# Patient Record
Sex: Female | Born: 1975 | Race: White | Hispanic: Yes | Smoking: Never smoker
Health system: Southern US, Community
[De-identification: ages and names within clinical notes are randomized; demographics above are authoritative.]

## PROBLEM LIST (undated history)

## (undated) DIAGNOSIS — E079 Disorder of thyroid, unspecified: Secondary | ICD-10-CM

## (undated) HISTORY — DX: Disorder of thyroid, unspecified: E07.9

## (undated) HISTORY — PX: CHOLECYSTECTOMY: SHX55

---

## 2005-02-11 ENCOUNTER — Inpatient Hospital Stay (HOSPITAL_COMMUNITY): Admission: AD | Admit: 2005-02-11 | Discharge: 2005-02-11 | Payer: Self-pay | Admitting: Obstetrics & Gynecology

## 2005-02-21 ENCOUNTER — Inpatient Hospital Stay (HOSPITAL_COMMUNITY): Admission: RE | Admit: 2005-02-21 | Discharge: 2005-02-21 | Payer: Self-pay | Admitting: Family Medicine

## 2005-03-08 ENCOUNTER — Ambulatory Visit (HOSPITAL_COMMUNITY): Admission: RE | Admit: 2005-03-08 | Discharge: 2005-03-08 | Payer: Self-pay | Admitting: Obstetrics

## 2005-03-24 ENCOUNTER — Inpatient Hospital Stay (HOSPITAL_COMMUNITY): Admission: AD | Admit: 2005-03-24 | Discharge: 2005-03-24 | Payer: Self-pay | Admitting: Obstetrics & Gynecology

## 2005-05-04 ENCOUNTER — Inpatient Hospital Stay (HOSPITAL_COMMUNITY): Admission: AD | Admit: 2005-05-04 | Discharge: 2005-05-07 | Payer: Self-pay | Admitting: Family Medicine

## 2005-05-11 ENCOUNTER — Encounter (HOSPITAL_COMMUNITY): Admission: RE | Admit: 2005-05-11 | Discharge: 2005-06-10 | Payer: Self-pay | Admitting: Obstetrics

## 2005-05-18 ENCOUNTER — Inpatient Hospital Stay (HOSPITAL_COMMUNITY): Admission: AD | Admit: 2005-05-18 | Discharge: 2005-05-20 | Payer: Self-pay | Admitting: Obstetrics

## 2005-05-19 ENCOUNTER — Encounter (INDEPENDENT_AMBULATORY_CARE_PROVIDER_SITE_OTHER): Payer: Self-pay | Admitting: *Deleted

## 2006-01-17 ENCOUNTER — Inpatient Hospital Stay (HOSPITAL_COMMUNITY): Admission: AD | Admit: 2006-01-17 | Discharge: 2006-01-18 | Payer: Self-pay | Admitting: Family Medicine

## 2006-02-14 ENCOUNTER — Encounter: Payer: Self-pay | Admitting: Family Medicine

## 2006-02-14 ENCOUNTER — Ambulatory Visit: Payer: Self-pay | Admitting: Family Medicine

## 2006-07-17 ENCOUNTER — Ambulatory Visit: Payer: Self-pay | Admitting: Obstetrics & Gynecology

## 2006-08-01 ENCOUNTER — Ambulatory Visit: Payer: Self-pay | Admitting: Gynecology

## 2006-08-17 ENCOUNTER — Inpatient Hospital Stay (HOSPITAL_COMMUNITY): Admission: AD | Admit: 2006-08-17 | Discharge: 2006-08-17 | Payer: Self-pay | Admitting: Obstetrics & Gynecology

## 2006-08-20 ENCOUNTER — Ambulatory Visit: Payer: Self-pay | Admitting: Obstetrics & Gynecology

## 2006-08-20 ENCOUNTER — Ambulatory Visit (HOSPITAL_COMMUNITY): Admission: RE | Admit: 2006-08-20 | Discharge: 2006-08-20 | Payer: Self-pay | Admitting: Obstetrics & Gynecology

## 2006-08-24 ENCOUNTER — Inpatient Hospital Stay (HOSPITAL_COMMUNITY): Admission: AD | Admit: 2006-08-24 | Discharge: 2006-08-25 | Payer: Self-pay | Admitting: Obstetrics and Gynecology

## 2006-09-05 ENCOUNTER — Ambulatory Visit: Payer: Self-pay | Admitting: Obstetrics & Gynecology

## 2006-10-03 ENCOUNTER — Ambulatory Visit: Payer: Self-pay | Admitting: Obstetrics and Gynecology

## 2007-01-24 ENCOUNTER — Ambulatory Visit (HOSPITAL_COMMUNITY): Admission: RE | Admit: 2007-01-24 | Discharge: 2007-01-24 | Payer: Self-pay | Admitting: Gynecology

## 2007-09-17 ENCOUNTER — Inpatient Hospital Stay (HOSPITAL_COMMUNITY): Admission: AD | Admit: 2007-09-17 | Discharge: 2007-11-07 | Payer: Self-pay | Admitting: Obstetrics

## 2007-09-23 ENCOUNTER — Encounter: Payer: Self-pay | Admitting: Obstetrics

## 2007-10-13 ENCOUNTER — Encounter: Payer: Self-pay | Admitting: Obstetrics

## 2007-11-01 ENCOUNTER — Encounter: Payer: Self-pay | Admitting: Obstetrics & Gynecology

## 2007-11-15 ENCOUNTER — Inpatient Hospital Stay (HOSPITAL_COMMUNITY): Admission: AD | Admit: 2007-11-15 | Discharge: 2007-11-15 | Payer: Self-pay | Admitting: Gynecology

## 2007-11-16 ENCOUNTER — Inpatient Hospital Stay (HOSPITAL_COMMUNITY): Admission: AD | Admit: 2007-11-16 | Discharge: 2007-11-16 | Payer: Self-pay | Admitting: Obstetrics & Gynecology

## 2007-11-21 ENCOUNTER — Inpatient Hospital Stay (HOSPITAL_COMMUNITY): Admission: AD | Admit: 2007-11-21 | Discharge: 2007-11-21 | Payer: Self-pay | Admitting: Obstetrics & Gynecology

## 2007-11-22 ENCOUNTER — Inpatient Hospital Stay (HOSPITAL_COMMUNITY): Admission: AD | Admit: 2007-11-22 | Discharge: 2007-11-22 | Payer: Self-pay | Admitting: Gynecology

## 2007-11-23 ENCOUNTER — Inpatient Hospital Stay (HOSPITAL_COMMUNITY): Admission: AD | Admit: 2007-11-23 | Discharge: 2007-11-23 | Payer: Self-pay | Admitting: Obstetrics and Gynecology

## 2007-11-27 ENCOUNTER — Inpatient Hospital Stay (HOSPITAL_COMMUNITY): Admission: AD | Admit: 2007-11-27 | Discharge: 2007-11-27 | Payer: Self-pay | Admitting: Obstetrics

## 2007-11-28 ENCOUNTER — Inpatient Hospital Stay (HOSPITAL_COMMUNITY): Admission: AD | Admit: 2007-11-28 | Discharge: 2007-11-28 | Payer: Self-pay | Admitting: Obstetrics

## 2008-09-11 IMAGING — US US OB LIMITED
1 series · 14 of 14 positions shown · non-contrast
Comparison: none

OBSTETRICAL ULTRASOUND:

 This ultrasound exam was performed in the [HOSPITAL] Ultrasound Department.  The OB US report was generated in the AS system, and faxed to the ordering physician.  This report is also available in [REDACTED] PACS.

[Series 1: us ob limited · 0.43mm/px · 14 of 14 slices shown]
[im 1/14]
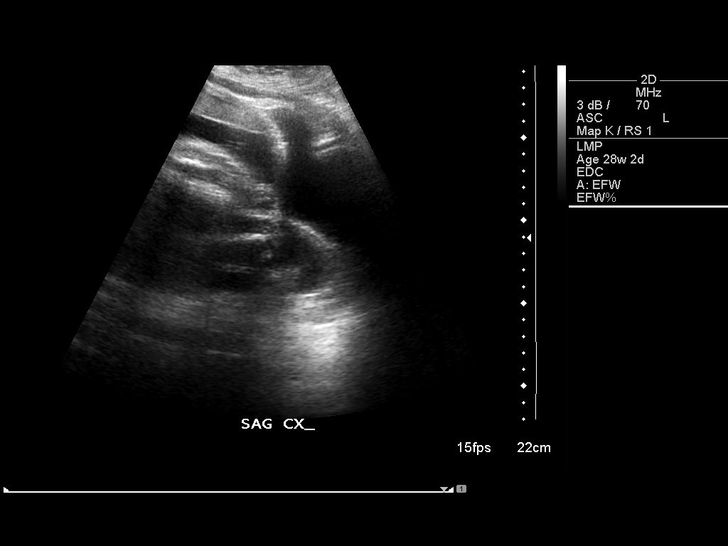
[im 2/14]
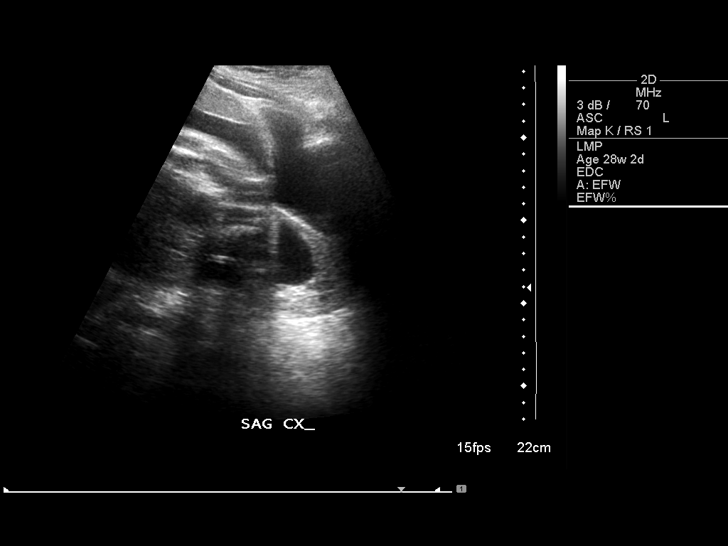
[im 3/14]
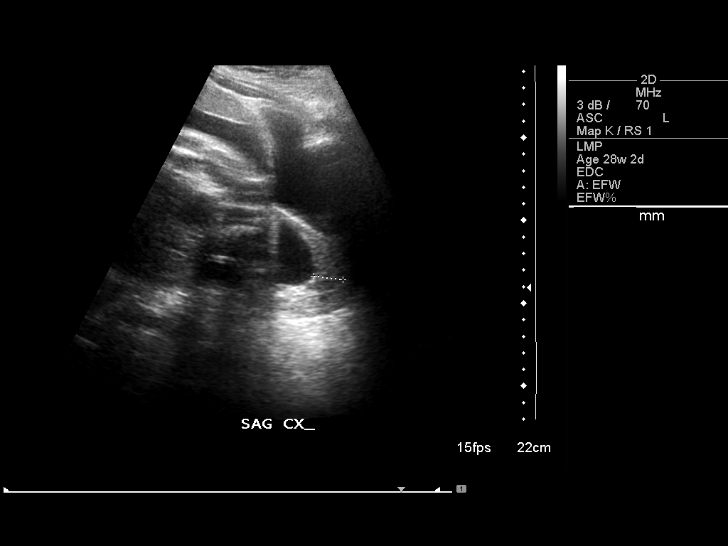
[im 4/14]
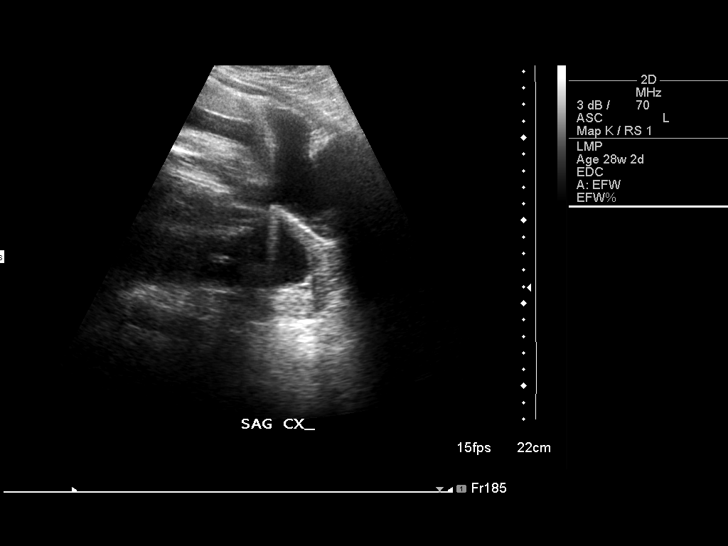
[im 5/14]
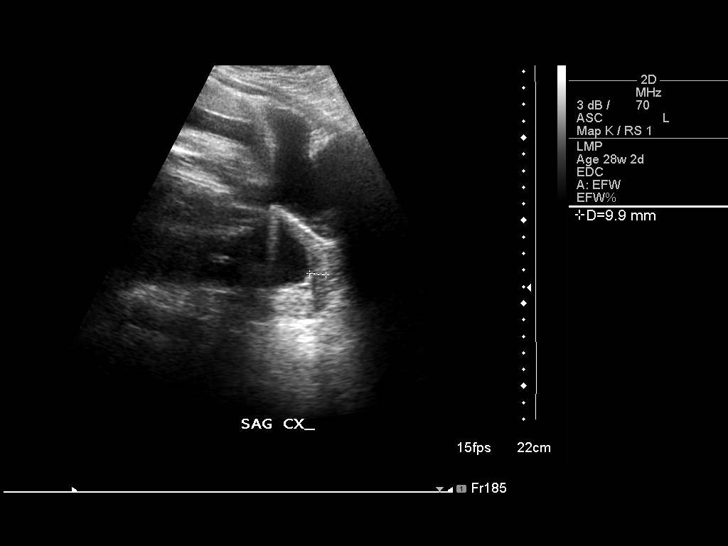
[im 6/14]
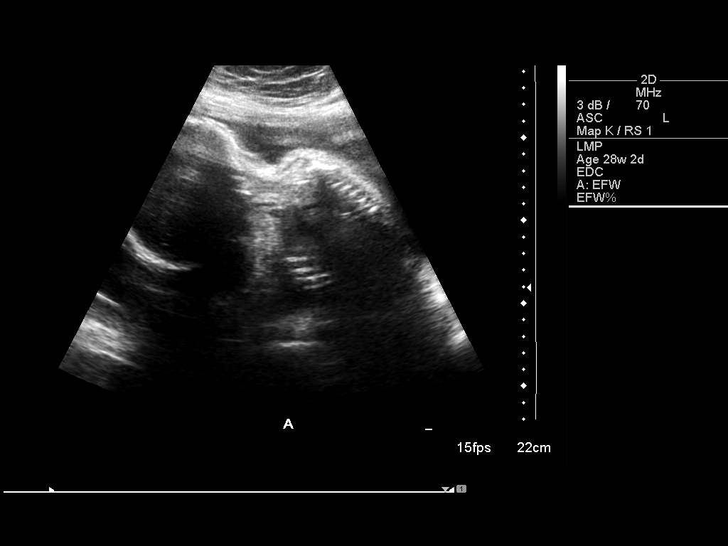
[im 7/14]
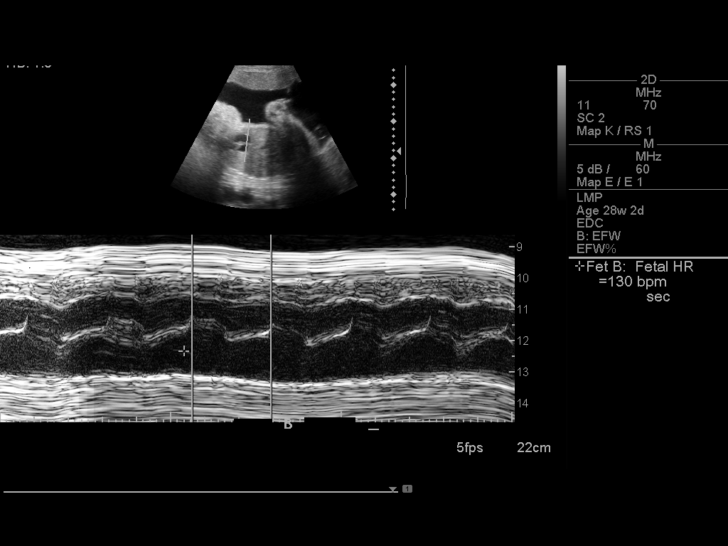
[im 8/14]
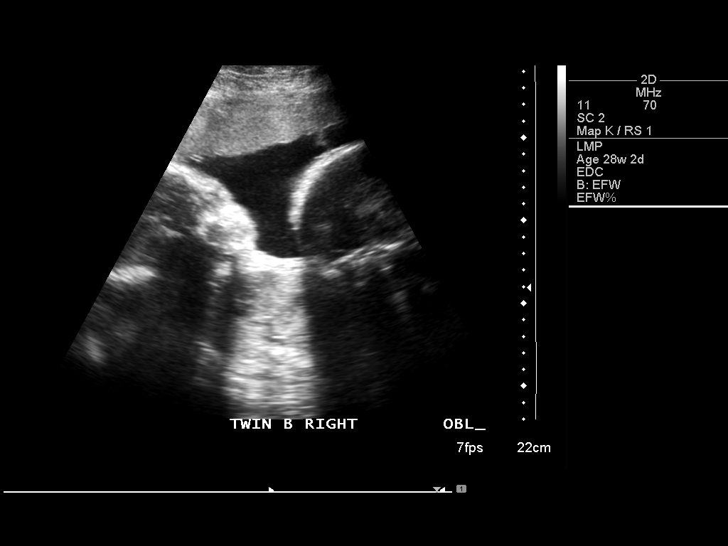
[im 9/14]
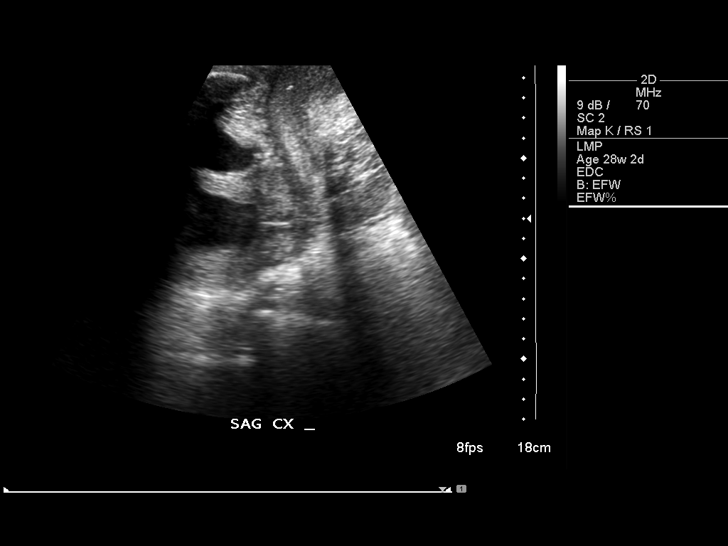
[im 10/14]
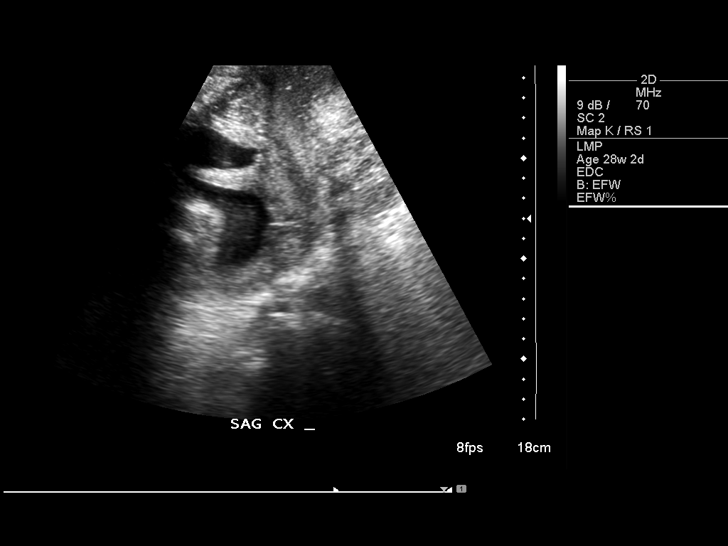
[im 11/14]
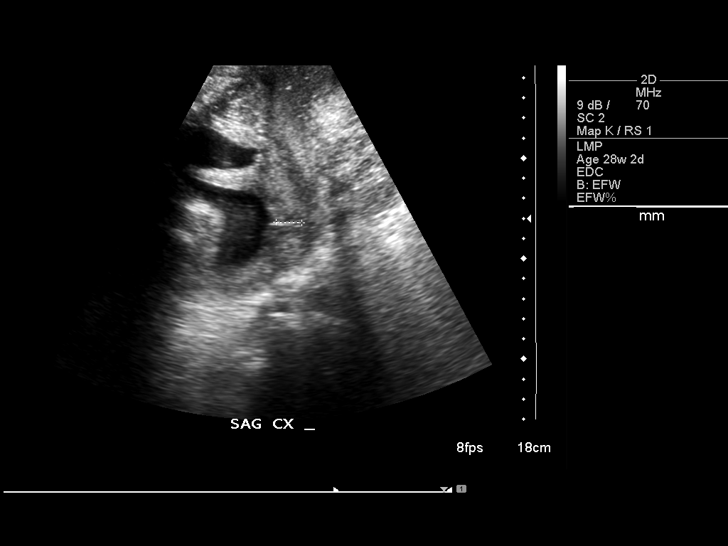
[im 12/14]
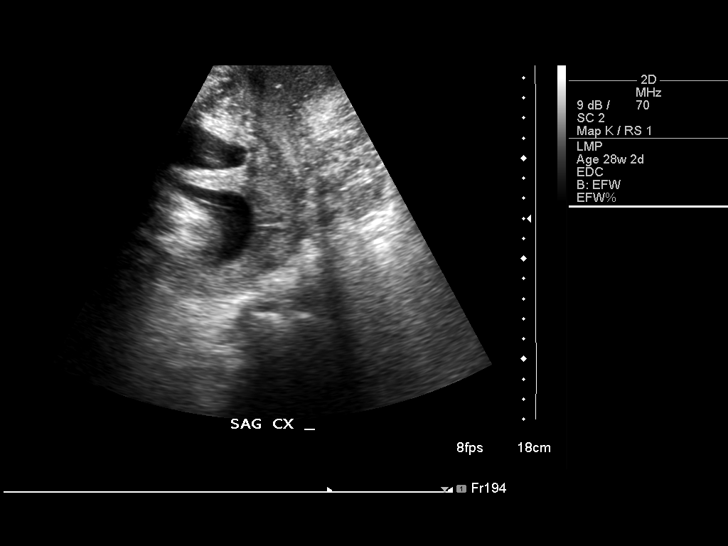
[im 13/14]
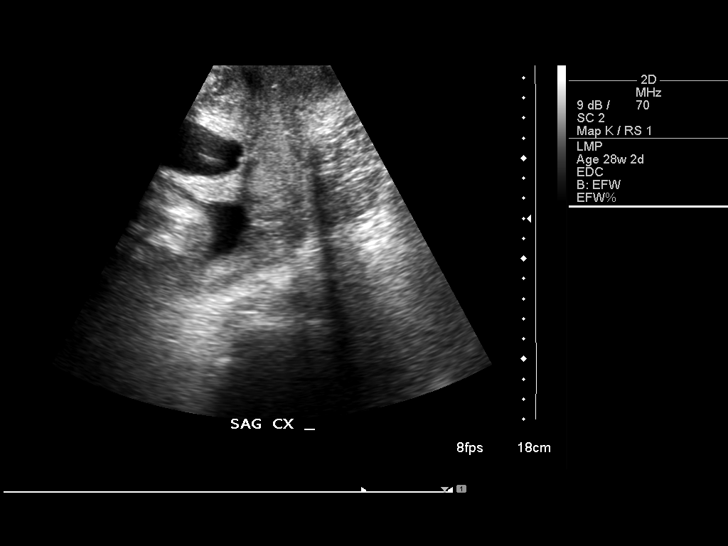
[im 14/14]
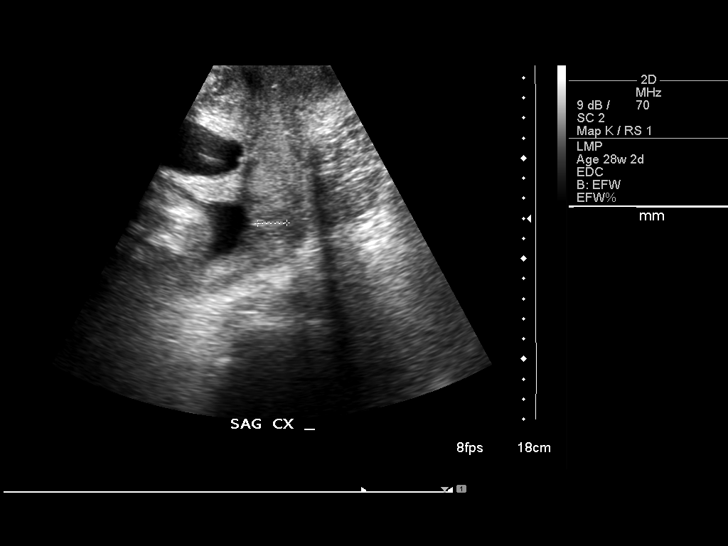

[14 of 14 positions shown; findings below may reference images not displayed]

IMPRESSION: See AS Obstetric US report.

## 2008-09-13 IMAGING — US US OB LIMITED
1 series · 14 of 28 positions shown · non-contrast
Comparison: none

OBSTETRICAL ULTRASOUND:

 This ultrasound exam was performed in the [HOSPITAL] Ultrasound Department.  The OB US report was generated in the AS system, and faxed to the ordering physician.  This report is also available in [REDACTED] PACS.

[Series 1: us ob limited · 0.39mm/px · 14 of 29 slices shown]
[im 2/29]
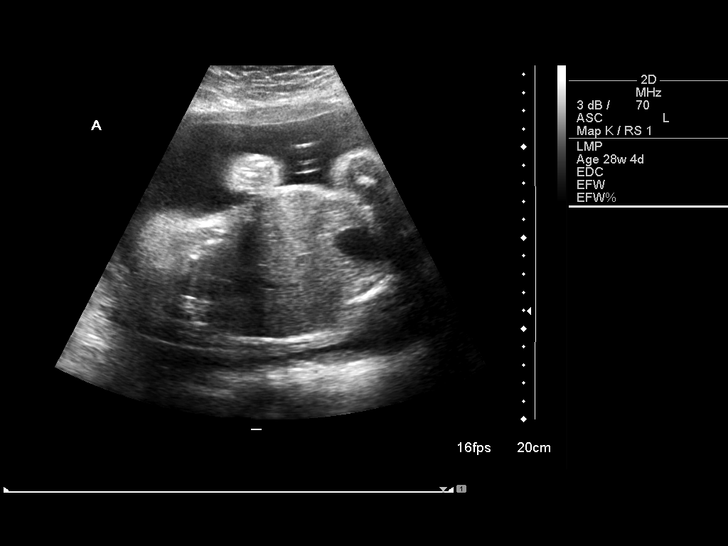
[im 4/29]
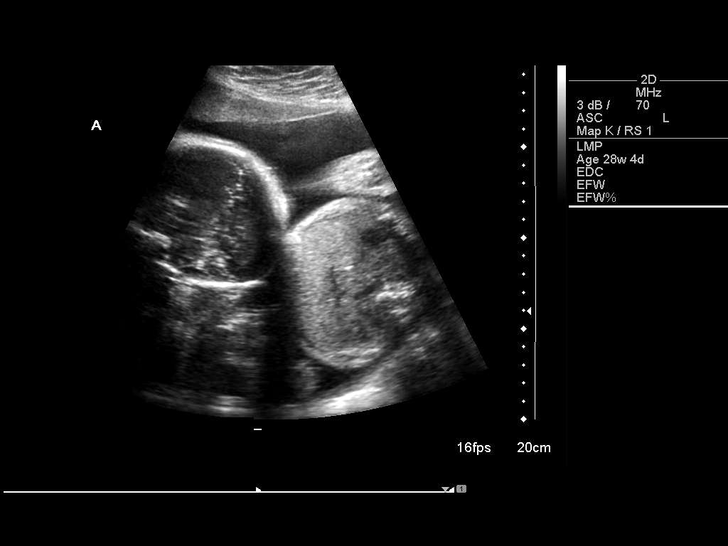
[im 6/29]
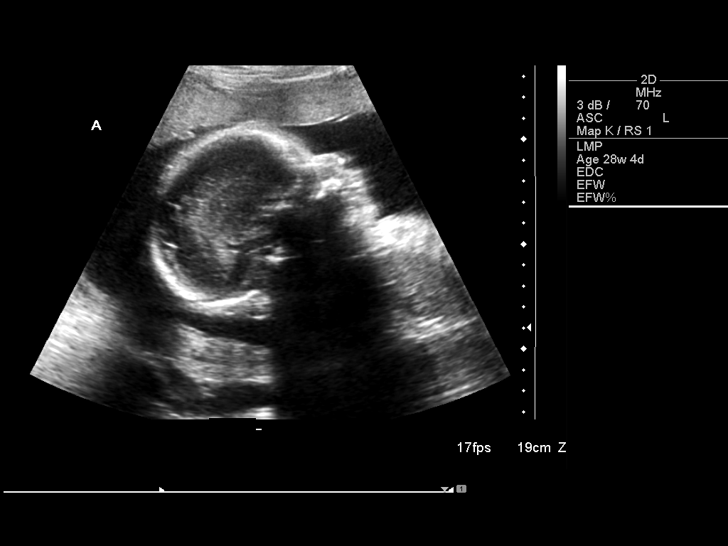
[im 8/29]
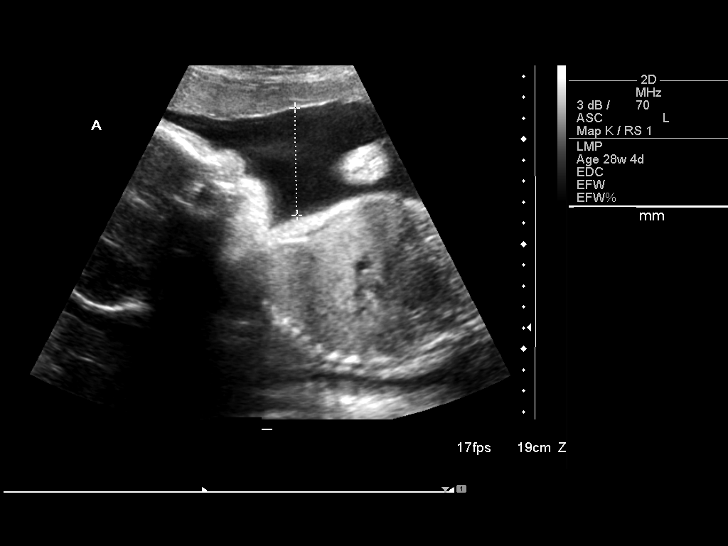
[im 10/29]
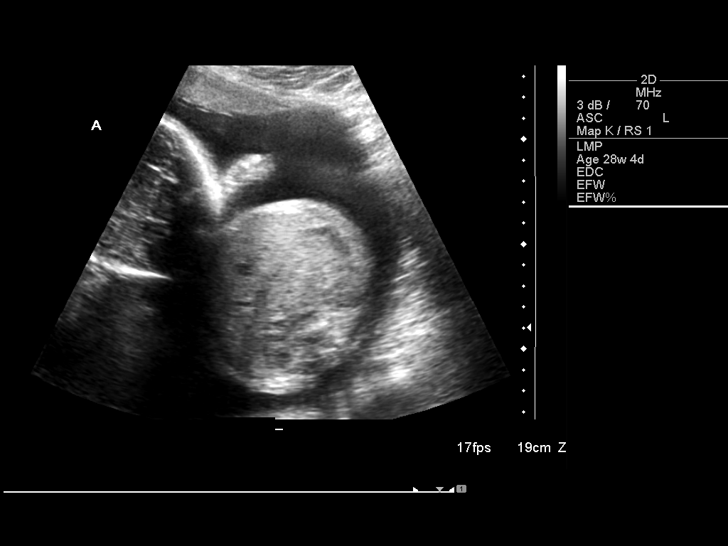
[im 12/29]
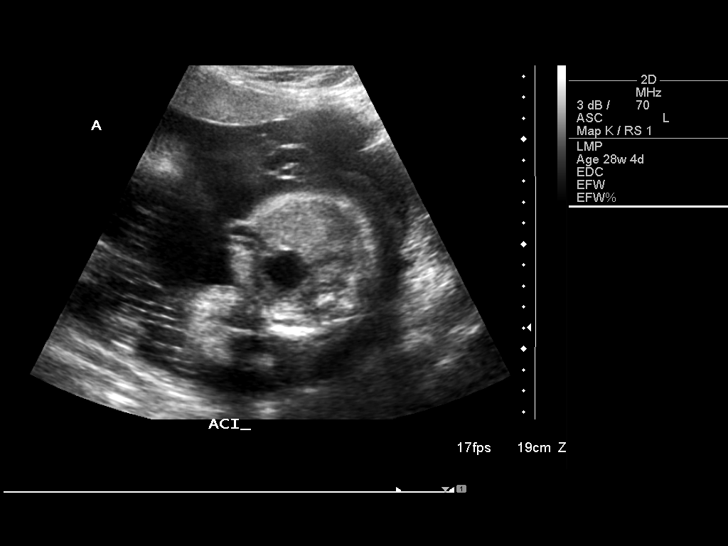
[im 14/29]
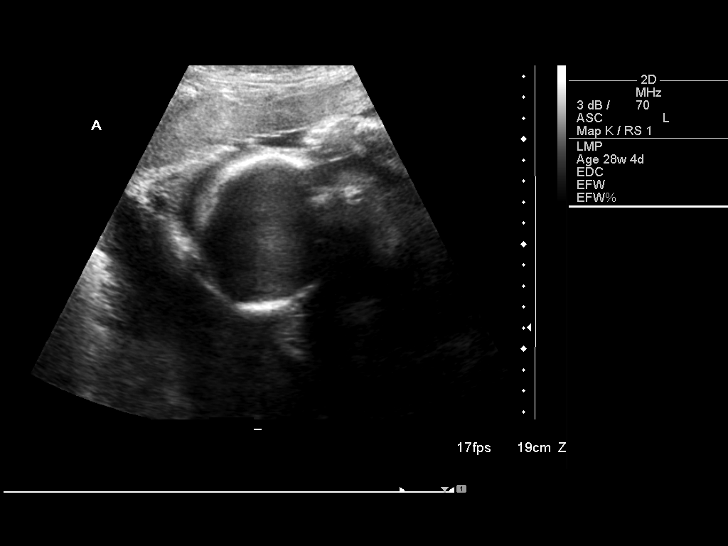
[im 16/29]
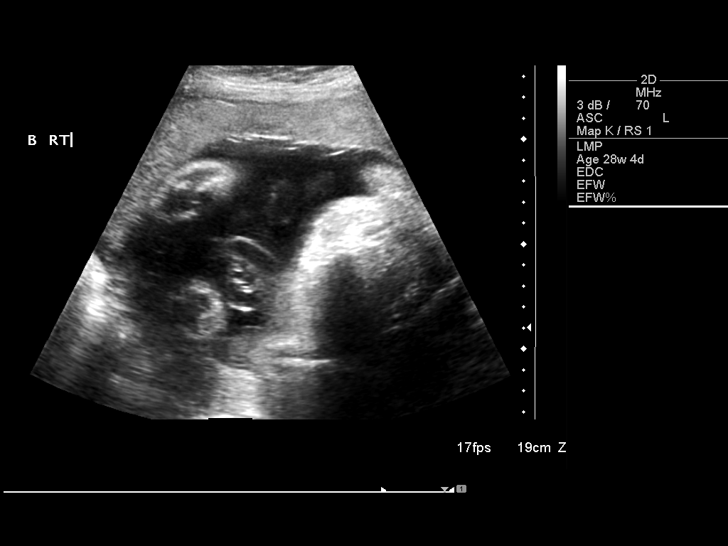
[im 18/29]
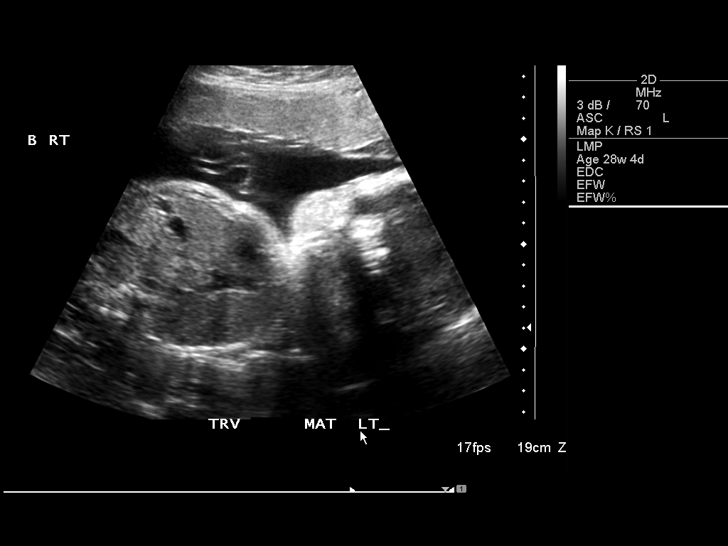
[im 20/29]
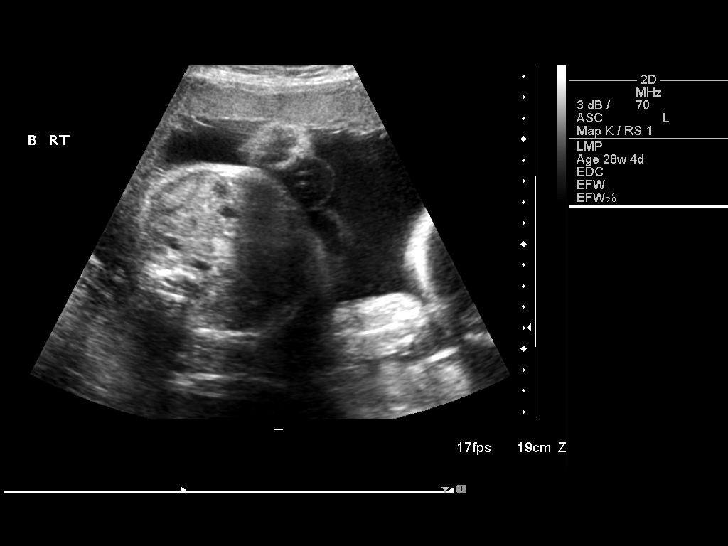
[im 22/29]
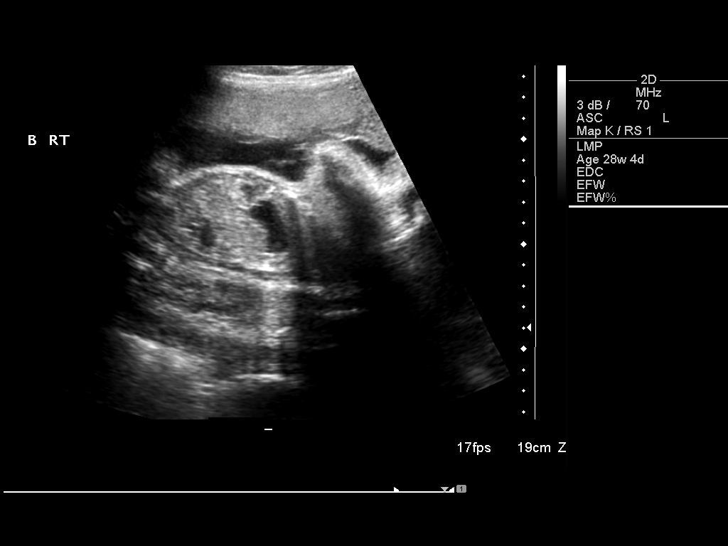
[im 24/29]
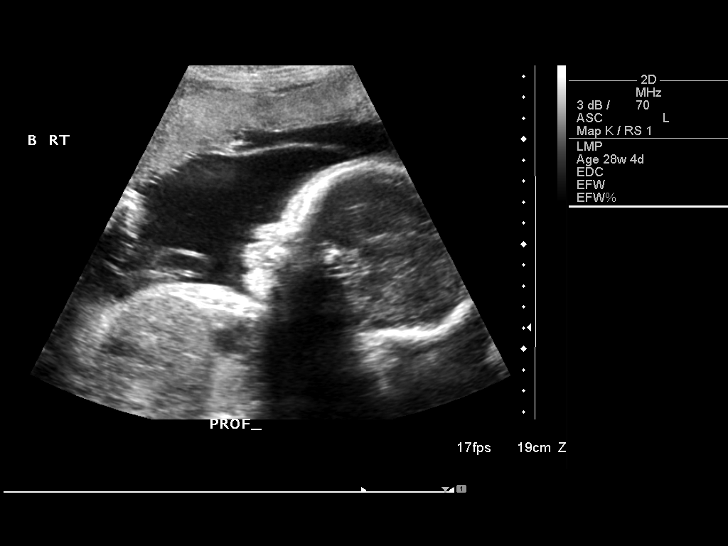
[im 26/29]
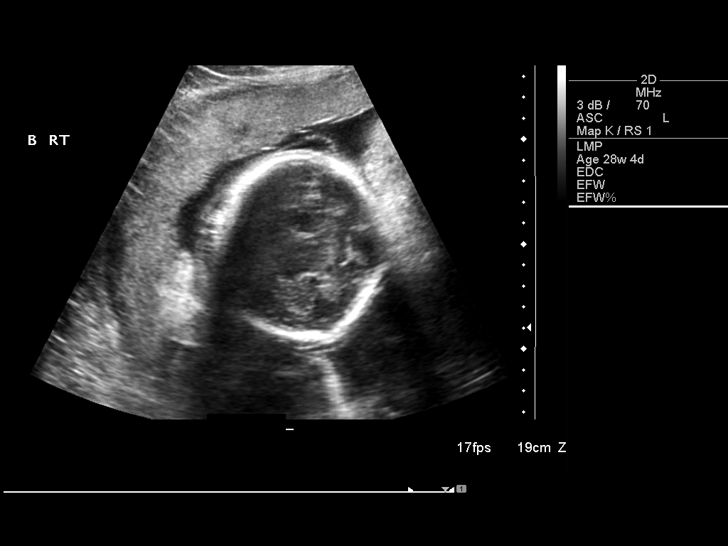
[im 29/29]
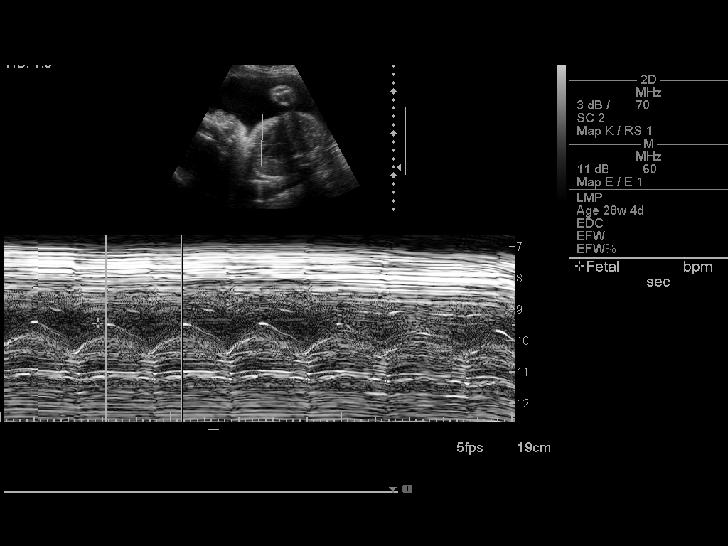

[14 of 28 positions shown; findings below may reference images not displayed]

IMPRESSION: See AS Obstetric US report.

## 2011-05-15 NOTE — Consult Note (Signed)
NAME:  Sierra Novak, Sierra Novak       ACCOUNT NO.:  [id number]   MEDICAL RECORD NO.:  [id number]          PATIENT TYPE:  INP   LOCATION:  9154                          [facility]   PHYSICIAN:  Toma Copier, MD           DATE OF BIRTH:  1976/07/31   DATE OF CONSULTATION:  09/22/2007  DATE OF DISCHARGE:                                 CONSULTATION   At the time of consultation, language interpreter, Veryl Speak Royal, was  present for the interpretation.   REFERRING PHYSICIAN:  Charles A. Clearance Coots, M.D.   REASON FOR CONSULTATION:  The patient is a gravida 5, para 1-0-3-0, at  27-[redacted] weeks gestation with a twin intrauterine pregnancy and there is  concern for cervical insufficiency.   The patient was admitted on September 17, 2007, after an office visit  was concerning for some vaginal bleeding and vaginal discharge and there  was no report of whether there was cervical dilatation.  She was  admitted for concern of cervical insufficiency versus preterm labor in  this preterm twin intrauterine pregnancy.  She has been followed by [facility] most recently on September 16, and has been managed with  modified bed rest.   Since her admission, the patient reports that there has been no  additional vaginal bleeding or abnormal discharge.  She denies any  uterine contractions and reports good fetal movement x2.  She has  received a course of antenatal corticosteroids.   PAST MEDICAL HISTORY:   PAST SURGICAL HISTORY:  Notable for cholecystectomy.   MEDICATIONS:  1. Prenatal vitamins.  2. Progesterone suppositories.  3. Baby aspirin.   ALLERGIES:  No known drug allergies.   SOCIAL HISTORY:  Negative history of tobacco, alcohol, or drug abuse.   FAMILY HISTORY:  There is a family history positive for hypertension.   PAST OBSTETRICAL HISTORY:  First pregnancy was complicated with an  intrauterine fetal demise at [redacted] weeks gestation.  Second pregnancy was  first trimester spontaneous  abortion.  Third pregnancy was 18-week  spontaneous loss.  Fourth pregnancy was first trimester spontaneous  loss.  Rhett Bannister 5 is a spontaneously conceived twin pregnancy.  There are  no ultrasound reports to review assessing cervical status at the time of  discharge.   PHYSICAL EXAMINATION:  GENERAL:  The patient is alert and oriented  without any complaints and in no acute distress.  Fetal heart rate  tracings were noted to be reassuring x2.  VITAL SIGNS:  Mother is afebrile and vital signs are stable.   RECOMMENDATIONS:  1. I recommend that we obtain an ultrasound evaluation of the cervix      and assess the fetal growth prior to making any additional      recommendations regarding inpatient versus outpatient care.  2. Based on the findings from the ultrasound assessment, I will gladly      make additional recommendations related to need for pharmacotherapy      and/or tocolytics.   Total face-to-face time during consultation was 30 minutes.  All  questions were answered to the patient.   Thank you for allowing me to participate.  If you have any additional  questions, please feel free to contact me.      Toma Copier, MD  Electronically Signed     SJ/MEDQ  D:  09/22/2007  T:  09/22/2007  Job:  [id number]

## 2011-05-15 NOTE — Op Note (Signed)
NAMEKRITIKA, STUKES       ACCOUNT NO.:  [id number]   MEDICAL RECORD NO.:  [id number]          PATIENT TYPE:  INP   LOCATION:                                [facility]   PHYSICIAN:  Roseanna Rainbow, M.D.DATE OF BIRTH:  [birth year]   DATE OF PROCEDURE:  11/01/2007  DATE OF DISCHARGE:                               OPERATIVE REPORT   PREOPERATIVE DIAGNOSIS:  Twin gestation at 33+ weeks, preterm labor,  breech, breech presentation, history of previous cesarean delivery.   POSTOPERATIVE DIAGNOSIS:  Twin gestation at 33+ weeks, preterm labor,  breech, breech presentation, history of previous cesarean delivery.   PROCEDURE:  Repeat low uterine flap elliptical cesarean delivery via  midline skin incision.   SURGEON:  Antionette Char, M.D.   ANESTHESIA:  Spinal.   PATHOLOGY:  Placenta.   ESTIMATED BLOOD LOSS:  600 mL.   COMPLICATIONS:  None.   IV FLUIDS/URINE OUTPUT:  As per anesthesiology.   PROCEDURE:  The patient was taken to the operating room with an IV  running and a Foley catheter in place.  She was given a spinal  anesthetic and then placed in the dorsal supine position with a leftward  tilt.  She was prepped and draped in the usual sterile fashion.  After a  time-out had been completed, the previous midline scar was excised with  the scalpel and Bovie.  The incision was then carried down to the  underlying fascia.  The fascia was incised along the length of the  incision.  The parietal peritoneum was entered and this incision was  then extended superiorly and inferiorly with good visualization of the  bladder.  The Alexis retractor was then placed into the incision.  The  vesicouterine peritoneum was tented up and entered sharply.  This  incision was then extended bilaterally and the bladder flap created  sharply.  The lower uterine segment was then incised in a transverse  fashion with a scalpel.  This incision was then extended bluntly.  Twin  A was  delivered as a complete breech extraction.  The cord was clamped  and cut and the infant was handed off to the awaiting neonatologist.  Twin B was delivered as a complete breech extraction as well.  The cord  was clamped and cut and the infant was handed off to the awaiting  neonatologist.  Twin A was a female. The umbilical artery pH was 7.35.  Twin B was female. the umbilical artery pH was 7.39.  The Apgars are 8 and  9 at one and one and 5 minutes respectively.  The placenta was then  removed, the intrauterine cavity was evacuated of any remaining amniotic  fluid, clots and debris with moist laparotomy sponge.  The uterine  incision was then closed in a running interlocking fashion using O  Monocryl.  A second imbricating layer of the same suture was then  placed.  Adequate hemostasis was noted.  The paracolic gutters were then  irrigated.  The retractor was then removed.  The parietal peritoneum and  fascia was closed in a single layer using  running sutures of #1 PDS.  The  skin was closed with staples.  At the  close of the procedure the instrument and pack counts were said to be  correct x2.  2 grams of cephazolin had been given at cord clamp.  The  patient was taken to the PACU awake and in stable condition.      Roseanna Rainbow, M.D.  Electronically Signed     LAJ/MEDQ  D:  11/01/2007  T:  11/03/2007  Job:  [id number]

## 2011-05-15 NOTE — H&P (Signed)
Sierra Novak, Sierra Novak       ACCOUNT NO.:  [id number]   MEDICAL RECORD NO.:  [id number]          PATIENT TYPE:  INP   LOCATION:  9154                          [facility]   PHYSICIAN:  Charles A. Clearance Coots, M.D.DATE OF BIRTH:  Jul 20, 1976   DATE OF ADMISSION:  09/17/2007  DATE OF DISCHARGE:                              HISTORY & PHYSICAL   ADMITTING DIAGNOSES:  1. Twenty-seven-week gestation.  2. Preterm cervical changes.  3. History of cervical incompetence.  4. Multiple pregnancy losses.   REASON FOR ADMISSION:  This is a 35 year old Hispanic female G5, P0 at  approximately [redacted] weeks gestation with twins, history of preterm cervical  changes and been followed with weekly ultrasounds for cervical  measurement at [facility].  She was seen at [facility] on  September 15, 2006 and complained of a brownish-to-pinkish vaginal  discharge, she but denied cramping or pressure.  The patient has been on  home management with modified bedrest. If the new findings were  discussed with Dr. Particia Nearing at [facility] and even though the  ultrasound for cervical measurement was reassuring it was thought that  with this new vaginal discharge that the patient should be admitted for  bedrest.   PAST MEDICAL/SURGICAL HISTORY:  Surgery - cholecystectomy.  Illnesses;  none.   MEDICATIONS:  Prenatal vitamins, progesterone suppositories and baby  aspirin.   ALLERGIES:  The patient has no known drug allergies.   SOCIAL HISTORY:  She is single.  Negative history of tobacco, alcohol or  recreational drug use.   FAMILY HISTORY:  The family history positive for hypertension.   PHYSICAL EXAMINATION:  GENERAL APPEARANCE:  On physical exam this is a  well-nourished, well-developed female in no acute distress.  LUNGS:  The lungs were clear to auscultation bilaterally.  HEART:  The heart has a regular rate and rhythm.  ABDOMEN:  The abdomen is gravid and nontender.  PELVIC:  The pelvic  exam is deferred.   IMPRESSION:  1. Twenty seven week gestation,  twins.  2. Preterm cervical changes.   PLAN:  Admit to the hospital for bedrest.      Bing Neighbors. Clearance Coots, M.D.  Electronically Signed     CAH/MEDQ  D:  09/17/2007  T:  09/18/2007  Job:  [id number]

## 2011-05-18 NOTE — Group Therapy Note (Signed)
NAME:  Sierra Novak, Sierra Novak       ACCOUNT NO.:  [id number]   MEDICAL RECORD NO.:  [id number]          PATIENT TYPE:  WOC   LOCATION:  [facility]                   [facility]AC[facility]   PHYSICIAN:  Argentina Donovan, MD        DATE OF BIRTH:  [birth year]   DATE OF SERVICE:  10/03/2006                                    CLINIC NOTE   Patient is a 36 year old Hispanic patient who recently had a D&C for a  miscarriage.  She is a gravida 4, para 0-1-2-1-0 who was in several weeks  ago for an antiphospholipid work-up.  She had positive ANA.  Everything else  seems to be normal.  Told to go on some baby aspirin, continue on her folic  acid and start progesterone injections immediately upon finding she is  pregnant next time.  She should wait one more month to try and conceive as  she has only had one period since her last miscarriage.   IMPRESSION:  Habitual abortion.           ______________________________  Argentina Donovan, MD     PR/MEDQ  D:  10/03/2006  T:  10/04/2006  Job:  [id number]

## 2011-05-18 NOTE — Discharge Summary (Signed)
NAMEKENSIE, SUSMAN       ACCOUNT NO.:  [id number]   MEDICAL RECORD NO.:  [id number]          PATIENT TYPE:  INP   LOCATION:  9302                          [facility]   PHYSICIAN:  Roseanna Rainbow, M.D.DATE OF BIRTH:  1976-05-02   DATE OF ADMISSION:  09/17/2007  DATE OF DISCHARGE:  11/07/2007                               DISCHARGE SUMMARY   CHIEF COMPLAINT:  The patient is a 35 year old para 0 with a twin  gestation at 27+ weeks with a history of preterm cervical changes for  admission for bedrest.  Please see the dictated history and physical as  per Dr. Clearance Coots for further details.   HOSPITAL COURSE:  The patient was admitted and started on clindamycin  parenterally.  She was also given course of betamethasone and she was  continued on 17-hydroxyprogesterone weekly injections.  Maternal-fetal  medicine was consulted.  She began to have uterine contractions.  She  was started on p.o. Procardia.  This was discontinued.  Magnesium  sulfate tocolysis was initiated and the rate was titrated to the uterine  activity.  She began to have less contractions of which she was aware.  On September 27 there was a question of bloody show.  There was no  cervical change.  It was felt that she was not ruptured and the  magnesium sulfate tocolysis was continued.  She was found to have a  urinary tract infection.  She was started on Macrobid which was then  changed to Rocephin empirically.  On October 31 the urine culture and  sensitivity grew out greater than 100,000 colonies/mL gram-negative  rods.  At this point Bactrim was started.  The culture and sensitivity  demonstrated Citrobacter that was sensitive to the Bactrim.  On November  1 she was grossly ruptured for clear fluid.  An ultrasound demonstrated  breech-breech presentation, no measurable cervix, and a low amniotic  fluid index for twin A.  At this point she was brought to the OR for a  cesarean delivery.  Please see  the dictated operative summary.  On  postoperative day #1, her hemoglobin was 8.2 which was down from 10.3.  She was hemodynamically stable.  On postoperative day #2, T-max was  102.3.  The right hand where her IV site was was swollen and tender.  At  this point she was felt that she may have an ascending urinary tract  infection.  Gentamicin was started as well as IV fluids.  The gentamicin  and clindamycin were continued and possible endometritis was entertained  in the differential diagnosis.  On postoperative day #4, breast  engorgement was noted.  Blood cultures were no growth to date.  She  defervesced.  On postoperative day #6 she had been afebrile for 48  hours.  The antibiotics were discontinued.   DISCHARGE DIAGNOSES:  1. Twin gestation at 33+ weeks.  2. Preterm premature rupture of membranes.  3. Postoperative urinary tract infection.   PROCEDURES:  Cesarean delivery.   CONDITION:  Stable.   DIET:  Regular.   ACTIVITY:  Progressive activity, pelvic rest.   MEDICATIONS:  Included Percocet, prenatal vitamins.   DISPOSITION:  The patient was to follow up in the office in several  days.      Roseanna Rainbow, M.D.  Electronically Signed     LAJ/MEDQ  D:  12/04/2007  T:  12/04/2007  Job:  [id number]

## 2011-05-18 NOTE — Op Note (Signed)
NAME:  Sierra Novak, Sierra Novak       ACCOUNT NO.:  [id number]   MEDICAL RECORD NO.:  [id number]          PATIENT TYPE:  AMB   LOCATION:  SDC                           [facility]   PHYSICIAN:  Lesly Dukes, M.D. DATE OF BIRTH:  Oct 29, 1976   DATE OF PROCEDURE:  08/20/2006  DATE OF DISCHARGE:                                 OPERATIVE REPORT   PREOPERATIVE DIAGNOSIS:  A 35 year old para 0-1-2-0 with missed abortion.   POSTOPERATIVE DIAGNOSIS:  A 35 year old para 0-1-2-0 with missed abortion.   PROCEDURE:  Dilation and curettage.   SURGEON:  Lesly Dukes, M.D.   ANESTHESIA:  MAC and local.   SPECIMENS:  Endometrial curettings to pathology.   ESTIMATED BLOOD LOSS:  Minimal.   COMPLICATIONS:  None.   FINDINGS:  A 7- to 8-week size uterus.   PROCEDURE:  After informed consent was obtained, the patient was taken to  the operating room where MAC anesthesia was induced. The patient put in  dorsal lithotomy position, prepared in a normal sterile fashion. The bladder  was emptied with a catheter. The bivalve speculum was placed in the  patient's vagina, and the cervix was brought into view. Anterior lip was  grasped with a single-toothed tenaculum. Twenty mL of 1% lidocaine was  injected at 3 and 9 o'clock. Cervical os was gently dilated to a #10 Hager  dilator. A #8 suction curet was gently introduced into the uterus, and  gentle suction and curettage was performed. Sharp curet was then introduced  into the uterus, and gentle sharp curettage was performed. One last pass  with the suction curet was then performed to assure that all products of  conception were removed from the uterus. All instruments were removed from  the patient's vagina. There was good hemostasis. The patient tolerated the  procedure well. Sponge, instrument and needle counts were correct x2. The  patient was taken to recovery room in stable condition.           ______________________________  Lesly Dukes, M.D.     KHL/MEDQ  D:  08/20/2006  T:  08/21/2006  Job:  [id number]

## 2011-05-18 NOTE — Discharge Summary (Signed)
NAMEKIRSTYN, Sierra Novak              ACCOUNT NO.:  [id number]   MEDICAL RECORD NO.:  [id number]          PATIENT TYPE:  INP   LOCATION:  9320                          [facility]   PHYSICIAN:  Kathreen Cosier, M.D.DATE OF BIRTH:  06/12/1976   DATE OF ADMISSION:  05/04/2005  DATE OF DISCHARGE:  05/07/2005                                 DISCHARGE SUMMARY   The patient is a 35 year old gravida 3, para 1-0-1-0, [facility] October 06, 2005.  She came in because of intermittent bleeding ever since she was pregnant.  The patient was known to be 17 weeks 4 days, and she came in with bleeding,  no pain.  Ultrasound performed was negative.  CBC, PT, PTT, fibrinogen were  all normal.  The patient was kept on bed rest and by May 7 she still had a  small amount of bright red bleeding, but it was decided since all her  studies were negative, she would be on bed rest at home.   DISCHARGE DIAGNOSIS:  Status post second trimester and first trimester  bleeding.       BAM/MEDQ  D:  06/20/2005  T:  06/20/2005  Job:  [id number]

## 2011-05-18 NOTE — Group Therapy Note (Signed)
NAME:  Sierra Novak, Sierra Novak NO.:  [id number]   MEDICAL RECORD NO.:  [id number]          PATIENT TYPE:  WOC   [facility]ATION:  [facility]                   [facility]   PHYSICIAN:  Tinnie Gens, MD        DATE OF BIRTH:  May 28, 1976   DATE OF SERVICE:                                    CLINIC NOTE   CHIEF COMPLAINT:  MAU referral for heavy bleeding.   HISTORY OF PRESENT ILLNESS:  Patient is a 35 year old gravida 3, para 1-0-1-  1, who has a very poor OB history including a fetal demise at term delivered  by a C-section in [place] and an 18-week fetal loss.  The patient has regular  cycles normally and she miscarried her last baby in June of last year.  She  was delivered by Dr. Gaynell Face at this hospital.  She was told to take  ginseng and __________  related to strengthening her uterus, and was on it  for approximately 2 weeks when she came up with abnormal vaginal bleeding.  The patient was seen and had workup that included a pelvic ultrasound, which  was abnormal and revealed a cystic and solid components of the uterine  cervix, which was enlarged.  Neoplasm cannot be excluded.  Normal  endometrium 7.2, and normal appearing left and right ovaries, and normal  appearing uterus.   PAST MEDICAL HISTORY:  Negative.   PAST SURGICAL HISTORY:  A C-section and cholecystectomy.   MEDICATIONS:  None.   ALLERGIES:  NONE KNOWN.   OBSTETRIC HISTORY:  She is a G3, P1-0-1-2-0.  She had a C-section at term  for fetal distress and nuchal cord with fetal demise.  She also had a SAB x2  including 1 at 18 weeks.   GYNECOLOGIC HISTORY:  No history of STDs and no history of abnormal Pap  smear.  She thinks her last Pap was done by Dr. Elsie Stain office.  She is  currently trying to get pregnant.   FAMILY HISTORY:  Negative.   SOCIAL HISTORY:  No tobacco, alcohol or drug use.   REVIEW OF SYSTEMS:  A 14-point review of systems is reviewed and it is only  positive for some abnormal  yellow vaginal discharge and a pain on her left  side with walking that has been there for approximately 2 years.   PHYSICAL EXAMINATION:  VITAL SIGNS:  As noted in the chart.  GENERAL: She is a well-developed, well-nourished Hispanic female in no acute  distress.  ABDOMEN:  Soft, nontender and nondistended.  GENITOURINARY:  Normal external female genitalia.  The vagina is pink and  rugated.  The cervix parous without lesion.  The uterus was small and  anteverted.  The adnexa were without mass or tenderness.  There is no  cervical masses felt by bimanual exam.   IMPRESSION:  Abnormal vaginal bleeding, question history of cervical mass.  No mass found on physical exam.   PLAN:  Pap smear today.  Follow up in 2 months to see if she has had any  further episodes of bleeding, which only occurred the 1 time.  Should it  recur, would consider further workup.  If her Pap smear is normal, she can  go ahead and possibly try to conceive.           ______________________________  Tinnie Gens, MD     TP/MEDQ  D:  02/14/2006  T:  02/14/2006  Job:  [id number]

## 2011-10-09 LAB — CULTURE, BLOOD (ROUTINE X 2)
Culture: NO GROWTH
Culture: NO GROWTH

## 2011-10-09 LAB — DIFFERENTIAL
Basophils Absolute: 0
Basophils Relative: 0
Eosinophils Relative: 3
Lymphocytes Relative: 12
Lymphocytes Relative: 21
Lymphs Abs: 1.4
Monocytes Absolute: 0.6
Monocytes Absolute: 0.7
Monocytes Relative: 10
Monocytes Relative: 5
Neutro Abs: 10.1 — ABNORMAL HIGH

## 2011-10-09 LAB — CBC
HCT: 19 — ABNORMAL LOW
HCT: 29.6 — ABNORMAL LOW
Hemoglobin: 6.6 — CL
Hemoglobin: 7.6 — CL
Hemoglobin: 8.2 — ABNORMAL LOW
MCHC: 34.8
MCHC: 34.8
Platelets: 222
Platelets: 232
RBC: 2.39 — ABNORMAL LOW
RDW: 13.4
RDW: 14
RDW: 14.2 — ABNORMAL HIGH

## 2011-10-09 LAB — SAMPLE TO BLOOD BANK

## 2011-10-10 LAB — WOUND CULTURE: Gram Stain: NONE SEEN

## 2011-10-10 LAB — CATH TIP CULTURE

## 2011-10-10 LAB — URINE CULTURE

## 2011-10-11 LAB — URINE CULTURE
Colony Count: >=100000
Colony Count: NO GROWTH
Culture: NO GROWTH
Special Requests: POSITIVE
Special Requests: NEGATIVE

## 2011-10-11 LAB — URINALYSIS, ROUTINE W REFLEX MICROSCOPIC
Bilirubin Urine: NEGATIVE
Glucose, UA: NEGATIVE
Ketones, ur: NEGATIVE
Nitrite: POSITIVE — AB
Protein, ur: NEGATIVE
Specific Gravity, Urine: 1.025
Specific Gravity, Urine: >1.03 — ABNORMAL HIGH
Urobilinogen, UA: 0.2

## 2011-10-11 LAB — CBC
Hemoglobin: 10.4 — ABNORMAL LOW
Platelets: 244
RDW: 13.1
WBC: 6.2

## 2011-10-11 LAB — MAGNESIUM
Magnesium: 4 — ABNORMAL HIGH
Magnesium: 4.5 — ABNORMAL HIGH
Magnesium: 4.5 — ABNORMAL HIGH

## 2011-10-11 LAB — SAMPLE TO BLOOD BANK

## 2011-10-11 LAB — URINE MICROSCOPIC-ADD ON

## 2014-02-01 ENCOUNTER — Ambulatory Visit: Payer: No Typology Code available for payment source | Attending: Internal Medicine

## 2014-03-22 ENCOUNTER — Encounter: Payer: Self-pay | Admitting: Internal Medicine

## 2014-03-22 ENCOUNTER — Ambulatory Visit: Payer: No Typology Code available for payment source | Attending: Internal Medicine | Admitting: Internal Medicine

## 2014-03-22 VITALS — BP 105/73 | HR 61 | Temp 98.0°F | Resp 16 | Wt 205.8 lb

## 2014-03-22 DIAGNOSIS — Z139 Encounter for screening, unspecified: Secondary | ICD-10-CM

## 2014-03-22 DIAGNOSIS — R519 Headache, unspecified: Secondary | ICD-10-CM | POA: Insufficient documentation

## 2014-03-22 DIAGNOSIS — R51 Headache: Secondary | ICD-10-CM

## 2014-03-22 DIAGNOSIS — Z008 Encounter for other general examination: Secondary | ICD-10-CM | POA: Insufficient documentation

## 2014-03-22 DIAGNOSIS — Z8249 Family history of ischemic heart disease and other diseases of the circulatory system: Secondary | ICD-10-CM | POA: Insufficient documentation

## 2014-03-22 LAB — CBC WITH DIFFERENTIAL/PLATELET
BASOS ABS: 0 10*3/uL (ref 0.0–0.1)
BASOS PCT: 0 % (ref 0–1)
EOS ABS: 0.2 10*3/uL (ref 0.0–0.7)
EOS PCT: 4 % (ref 0–5)
HEMATOCRIT: 40.5 % (ref 36.0–46.0)
Hemoglobin: 13.6 g/dL (ref 12.0–15.0)
LYMPHS PCT: 36 % (ref 12–46)
Lymphs Abs: 2.2 10*3/uL (ref 0.7–4.0)
MCH: 30.4 pg (ref 26.0–34.0)
MCHC: 33.6 g/dL (ref 30.0–36.0)
MCV: 90.6 fL (ref 78.0–100.0)
MONO ABS: 0.5 10*3/uL (ref 0.1–1.0)
Monocytes Relative: 8 % (ref 3–12)
Neutro Abs: 3.2 10*3/uL (ref 1.7–7.7)
Neutrophils Relative %: 52 % (ref 43–77)
PLATELETS: 308 10*3/uL (ref 150–400)
RBC: 4.47 MIL/uL (ref 3.87–5.11)
RDW: 14.1 % (ref 11.5–15.5)
WBC: 6.1 10*3/uL (ref 4.0–10.5)

## 2014-03-22 LAB — LIPID PANEL
CHOL/HDL RATIO: 4 ratio
CHOLESTEROL: 174 mg/dL (ref 0–200)
HDL: 43 mg/dL (ref >39)
LDL Cholesterol: 108 mg/dL — ABNORMAL HIGH (ref 0–99)
TRIGLYCERIDES: 117 mg/dL (ref <150)
VLDL: 23 mg/dL (ref 0–40)

## 2014-03-22 LAB — COMPLETE METABOLIC PANEL WITH GFR
ALT: 21 U/L (ref 0–35)
AST: 20 U/L (ref 0–37)
Albumin: 4.2 g/dL (ref 3.5–5.2)
Alkaline Phosphatase: 49 U/L (ref 39–117)
BILIRUBIN TOTAL: 0.6 mg/dL (ref 0.2–1.2)
BUN: 9 mg/dL (ref 6–23)
CALCIUM: 9.1 mg/dL (ref 8.4–10.5)
CHLORIDE: 105 meq/L (ref 96–112)
CO2: 23 mEq/L (ref 19–32)
CREATININE: 0.49 mg/dL — AB (ref 0.50–1.10)
GFR, Est African American: >89 mL/min
GFR, Est Non African American: >89 mL/min
Glucose, Bld: 96 mg/dL (ref 70–99)
Potassium: 4.2 mEq/L (ref 3.5–5.3)
Sodium: 137 mEq/L (ref 135–145)
Total Protein: 6.7 g/dL (ref 6.0–8.3)

## 2014-03-22 NOTE — Progress Notes (Signed)
Patient Demographics  Sierra Novak, is a 38 y.o. female  ZOX:[id number]  [id number]:[id number]  MRN:[id number]  DOB - [birth year]  CC:  Chief Complaint  Patient presents with  . Establish Care       HPI: Sierra Novak is a 38 y.o. female here today to establish medical care. Patient reported to have headache on and off for that she takes ibuprofen, denies any headache currently.  Patient has No headache, No chest pain, No abdominal pain - No Nausea, No new weakness tingling or numbness, No Cough - SOB.  No Known Allergies History reviewed. No pertinent past medical history. No current outpatient prescriptions on file prior to visit.   No current facility-administered medications on file prior to visit.   Family History  Problem Relation Age of Onset  . Hypertension Father    History   Social History  . Marital Status: Single    Spouse Name: N/A    Number of Children: N/A  . Years of Education: N/A   Occupational History  . Not on file.   Social History Main Topics  . Smoking status: Never Smoker   . Smokeless tobacco: Not on file  . Alcohol Use: No  . Drug Use: Not on file  . Sexual Activity: Not on file   Other Topics Concern  . Not on file   Social History Narrative  . No narrative on file    Review of Systems: Constitutional: Negative for fever, chills, diaphoresis, activity change, appetite change and fatigue. HENT: Negative for ear pain, nosebleeds, congestion, facial swelling, rhinorrhea, neck pain, neck stiffness and ear discharge.  Eyes: Negative for pain, discharge, redness, itching and visual disturbance. Respiratory: Negative for cough, choking, chest tightness, shortness of breath, wheezing and stridor.  Cardiovascular: Negative for chest pain, palpitations and leg swelling. Gastrointestinal: Negative for abdominal distention. Genitourinary: Negative for dysuria, urgency, frequency, hematuria, flank pain, decreased urine volume, difficulty  urinating and dyspareunia.  Musculoskeletal: Negative for back pain, joint swelling, arthralgia and gait problem. Neurological: Negative for dizziness, tremors, seizures, syncope, facial asymmetry, speech difficulty, weakness, light-headedness, numbness and headaches.  Hematological: Negative for adenopathy. Does not bruise/bleed easily. Psychiatric/Behavioral: Negative for hallucinations, behavioral problems, confusion, dysphoric mood, decreased concentration and agitation.    Objective:   Filed Vitals:   03/22/14 1045  BP: 105/73  Pulse: 61  Temp: 98 F (36.7 C)  Resp: 16    Physical Exam: Constitutional: Patient appears well-developed and well-nourished. No distress. HENT: Normocephalic, atraumatic, External right and left ear normal. Oropharynx is clear and moist.  Eyes: Conjunctivae and EOM are normal. PERRLA, no scleral icterus. Neck: Normal ROM. Neck supple. No JVD. No tracheal deviation. No thyromegaly. CVS: RRR, S1/S2 +, no murmurs, no gallops, no carotid bruit.  Pulmonary: Effort and breath sounds normal, no stridor, rhonchi, wheezes, rales.  Abdominal: Soft. BS +, no distension, tenderness, rebound or guarding.  Musculoskeletal: Normal range of motion. No edema and no tenderness.  Neuro: Alert. Normal reflexes, muscle tone coordination. No cranial nerve deficit. Equal strength all extremities. Skin: Skin is warm and dry. No rash noted. Not diaphoretic. No erythema. No pallor. Psychiatric: Normal mood and affect. Behavior, judgment, thought content normal.  Lab Results  Component Value Date   WBC 6.5 11/05/2007   HGB 6.6 REPEATED TO VERIFY CRITICAL RESULT CALLED TO, READ BACK BY AND VERIFIED WITH: NICHOLS,S AT 1145 ON 11/05/07 BY THOMPSON,K* 11/05/2007   HCT 19.0* 11/05/2007   MCV 90.5 11/05/2007   PLT 366 DELTA CHECK NOTED 11/05/2007  Lab Results  Component Value Date   CREATININE 0.62 11/03/2007    No results found for this basename: HGBA1C   Lipid Panel  No  results found for this basename: chol, trig, hdl, cholhdl, vldl, ldlcalc       Assessment and plan:   1. Screening Baseline blood work. - CBC with Differential - COMPLETE METABOLIC PANEL WITH GFR - TSH - Lipid panel - Vit D  25 hydroxy (rtn osteoporosis monitoring)  2. Headache(784.0) Patient takes Ibuprofen when necessary.  Patient will be scheduled for Pap smear.   Return in about 6 weeks (around 05/03/2014).     Doris Cheadle, MD

## 2014-03-22 NOTE — Progress Notes (Signed)
Patient here to establish care Takes no prescribed medications Occasionally takes motrin

## 2014-03-23 ENCOUNTER — Telehealth: Payer: Self-pay

## 2014-03-23 LAB — VITAMIN D 25 HYDROXY (VIT D DEFICIENCY, FRACTURES): Vit D, 25-Hydroxy: 17 ng/mL — ABNORMAL LOW (ref 30–89)

## 2014-03-23 LAB — TSH: TSH: 2.109 u[IU]/mL (ref 0.350–4.500)

## 2014-03-23 MED ORDER — VITAMIN D (ERGOCALCIFEROL) 1.25 MG (50000 UNIT) PO CAPS: 50000.0000 [IU] | ORAL_CAPSULE | ORAL | Status: DC

## 2014-03-23 NOTE — Telephone Encounter (Signed)
Interpreter line used Patient is aware of her lab results Prescription was sent to [facility] on file

## 2014-03-23 NOTE — Telephone Encounter (Signed)
Message copied by Lestine MountJUAREZ, Dorthia Tout L on Tue Mar 23, 2014  4:22 PM ------      Message from: Doris CheadleADVANI, DEEPAK      Created: Tue Mar 23, 2014 11:23 AM       Blood work reviewed, noticed low vitamin D, call patient advise to start ergocalciferol 50,000 units once a week for the duration of  12 weeks.       ------

## 2014-05-13 ENCOUNTER — Ambulatory Visit: Payer: No Typology Code available for payment source | Admitting: Internal Medicine

## 2014-05-13 ENCOUNTER — Other Ambulatory Visit (HOSPITAL_COMMUNITY)
Admission: RE | Admit: 2014-05-13 | Discharge: 2014-05-13 | Disposition: A | Discharge status: Home / Self Care | Payer: No Typology Code available for payment source | Source: Ambulatory Visit | Attending: Family Medicine | Admitting: Family Medicine

## 2014-05-13 ENCOUNTER — Ambulatory Visit (INDEPENDENT_AMBULATORY_CARE_PROVIDER_SITE_OTHER): Payer: No Typology Code available for payment source | Admitting: Family Medicine

## 2014-05-13 VITALS — BP 122/71 | HR 69 | Temp 98.0°F | Wt 200.6 lb

## 2014-05-13 DIAGNOSIS — Z1151 Encounter for screening for human papillomavirus (HPV): Secondary | ICD-10-CM | POA: Insufficient documentation

## 2014-05-13 DIAGNOSIS — Z01419 Encounter for gynecological examination (general) (routine) without abnormal findings: Secondary | ICD-10-CM | POA: Insufficient documentation

## 2014-05-13 DIAGNOSIS — Z124 Encounter for screening for malignant neoplasm of cervix: Secondary | ICD-10-CM

## 2014-05-13 NOTE — Progress Notes (Signed)
Interpreter Wyvonnia DuskyGraciela Namihira for [facility]

## 2014-05-18 NOTE — Progress Notes (Signed)
Patient ID: Sierra Novak, female   DOB: 1976-06-15, 38 y.o.   MRN: [id number] Visit for pap smear/ well woman GYN.  Patient   is   having periods They are   regular She   is not  Currently sexually active with men Contraceptive needs: None Pelvic issues such as pain, discharge, abnormal bleeding? None Smoker?   Never smoker Any pertinent FH of cervical or uterine cancer, breat cancer? None Any particular pertinent personal medical history?  No family history of breast, cervical, endometrial cancer that she is aware of.  GENERAL: Well-developed female no acute distress GU externally normal. No adnexal masses or tenderness. Uterus is normal in size and position. No vaginal discharge.  Pap isobtained

## 2014-05-27 ENCOUNTER — Encounter: Payer: Self-pay | Admitting: Family Medicine

## 2014-08-25 ENCOUNTER — Ambulatory Visit: Payer: Self-pay | Attending: Internal Medicine

## 2014-09-28 ENCOUNTER — Encounter: Payer: Self-pay | Admitting: Internal Medicine

## 2014-09-28 ENCOUNTER — Ambulatory Visit: Payer: Self-pay | Attending: Internal Medicine | Admitting: Internal Medicine

## 2014-09-28 VITALS — BP 120/80 | HR 70 | Temp 98.3°F | Resp 16 | Wt 196.8 lb

## 2014-09-28 DIAGNOSIS — Z23 Encounter for immunization: Secondary | ICD-10-CM

## 2014-09-28 DIAGNOSIS — R209 Unspecified disturbances of skin sensation: Secondary | ICD-10-CM | POA: Insufficient documentation

## 2014-09-28 DIAGNOSIS — R202 Paresthesia of skin: Secondary | ICD-10-CM

## 2014-09-28 DIAGNOSIS — E559 Vitamin D deficiency, unspecified: Secondary | ICD-10-CM

## 2014-09-28 DIAGNOSIS — Z9089 Acquired absence of other organs: Secondary | ICD-10-CM | POA: Insufficient documentation

## 2014-09-28 DIAGNOSIS — R200 Anesthesia of skin: Secondary | ICD-10-CM | POA: Insufficient documentation

## 2014-09-28 LAB — COMPLETE METABOLIC PANEL WITH GFR
ALT: 19 U/L (ref 0–35)
AST: 19 U/L (ref 0–37)
Albumin: 4.3 g/dL (ref 3.5–5.2)
Alkaline Phosphatase: 54 U/L (ref 39–117)
BUN: 10 mg/dL (ref 6–23)
CHLORIDE: 108 meq/L (ref 96–112)
CO2: 18 mEq/L — ABNORMAL LOW (ref 19–32)
Calcium: 9.3 mg/dL (ref 8.4–10.5)
Creat: 0.63 mg/dL (ref 0.50–1.10)
Glucose, Bld: 105 mg/dL — ABNORMAL HIGH (ref 70–99)
Potassium: 4 mEq/L (ref 3.5–5.3)
SODIUM: 138 meq/L (ref 135–145)
Total Bilirubin: 0.6 mg/dL (ref 0.2–1.2)
Total Protein: 7 g/dL (ref 6.0–8.3)

## 2014-09-28 LAB — VITAMIN B12: VITAMIN B 12: 552 pg/mL (ref 211–911)

## 2014-09-28 MED ORDER — GABAPENTIN 100 MG PO CAPS: 100.0000 mg | ORAL_CAPSULE | Freq: Three times a day (TID) | ORAL | Status: DC

## 2014-09-28 NOTE — Progress Notes (Signed)
MRN: [id number] Name: Sierra Novak  Sex: female Age: 38 y.o. DOB: [birth year]  Allergies: Review of patient's allergies indicates no known allergies.  Chief Complaint  Patient presents with  . Establish Care    HPI: Patient is 38 y.o. female who has been seen in in our office in the past has a different medical record number ( MR#  [id number]), history of vitamin D deficiency comes today reported to have feeling some numbness on her right face which she feels started 15 days ago when she was cleaning and especially the Clorox product she used initially felt some burning sensation on the face ? which improves when she eat , denies any headache dizziness weakness chest and shortness of breath, denies any fever chills. History reviewed. No pertinent past medical history.  Past Surgical History  Procedure Laterality Date  . Cesarean section    . Cholecystectomy        Medication List       This list is accurate as of: 09/28/14 10:56 AM.  Always use your most recent med list.               gabapentin 100 MG capsule  Commonly known as:  NEURONTIN  Take 1 capsule (100 mg total) by mouth 3 (three) times daily.        Meds ordered this encounter  Medications  . gabapentin (NEURONTIN) 100 MG capsule    Sig: Take 1 capsule (100 mg total) by mouth 3 (three) times daily.    Dispense:  90 capsule    Refill:  3     There is no immunization history on file for this patient.  History reviewed. No pertinent family history.  History  Substance Use Topics  . Smoking status: Never Smoker   . Smokeless tobacco: Not on file  . Alcohol Use: No    Review of Systems   As noted in HPI  Filed Vitals:   09/28/14 1019  BP: 120/80  Pulse: 70  Temp: 98.3 F (36.8 C)  Resp: 16    Physical Exam  Physical Exam  Constitutional: She is oriented to person, place, and time. No distress.  HENT:  Head: Normocephalic and atraumatic.  Right Ear: External ear normal.  Left  Ear: External ear normal.  Face sensation to touch equal bilaterally  Eyes: EOM are normal. Pupils are equal, round, and reactive to light.  Neck: Neck supple.  Cardiovascular: Normal rate and regular rhythm.   Pulmonary/Chest: Breath sounds normal. No respiratory distress. She has no wheezes. She has no rales.  Musculoskeletal: She exhibits no edema.  Neurological: She is oriented to person, place, and time. She has normal reflexes. No cranial nerve deficit. She exhibits normal muscle tone.  Psychiatric: Affect normal.    CBC No results found for this basename: wbc, rbc, hgb, hct, plt, mcv, neutrabs, lymphsabs, monoabs, eosabs, basosabs    CMP  No results found for this basename: na, k, cl, co2, glucose, bun, creatinine, calcium, prot, albumin, ast, alt, alkphos, bilitot, gfrnonaa, gfraa    No results found for this basename: chol, tri, ldl    No components found with this basename: hga1c    No results found for this basename: AST    Assessment and Plan  Numbness and tingling - Plan: Neurological examination is benign, trial of gabapentin (NEURONTIN) 100 MG capsule, will check her Vitamin B12, COMPLETE METABOLIC PANEL WITH GFR, also advise patient to avoid use of Clorox products she is probably allergic to  the chemical.   Health Maintenance  -Pap Smear: uptodate  Documented in different MR#  [id number]    -Influenza shot today   Return in about 3 months (around 12/28/2014), or if symptoms worsen or fail to improve.  Doris Cheadle, MD

## 2014-09-28 NOTE — Progress Notes (Signed)
Interpreter line used Patient here to establish care Complains of working with cleaning products and now her  Mouth and face are slightly numb

## 2014-09-29 ENCOUNTER — Telehealth: Payer: Self-pay | Admitting: *Deleted

## 2014-09-29 NOTE — Telephone Encounter (Signed)
Message copied by Dyann KiefGIRALDEZ, Marinus Eicher M on Wed Sep 29, 2014  4:23 PM ------      Message from: Doris CheadleADVANI, DEEPAK      Created: Wed Sep 29, 2014  9:29 AM       Blood work reviewed noticed impaired fasting glucose, call and advise patient for low carbohydrate diet.       ------

## 2014-09-29 NOTE — Telephone Encounter (Signed)
Pt aware of lab results 

## 2014-10-19 ENCOUNTER — Telehealth: Payer: Self-pay | Admitting: Internal Medicine

## 2014-10-19 NOTE — Telephone Encounter (Signed)
Pt is saying that the medication gabapentin (NEURONTIN) 100 MG capsule has been making her teeth feel really sensitive to cold or hot on her right side. She says it is painful like if they are going to fall out. Please call patient back to advise upon this.

## 2014-10-21 ENCOUNTER — Telehealth: Payer: Self-pay

## 2014-10-21 ENCOUNTER — Telehealth: Payer: Self-pay | Admitting: Internal Medicine

## 2014-10-21 NOTE — Telephone Encounter (Signed)
Patient showed up in the office stating she had a reaction to gabapentin Instructed patient to stop the medication Can not put her on pass program for lyrica because she has no proof of residency  [place]

## 2014-10-21 NOTE — Telephone Encounter (Signed)
Pt. Came into facility stating that she has been having a reaction from medication gabapentin (NEURONTIN) 100 MG capsule. Pt states that when taking medication the right side of her face starts to burn. Please f/u with pt.

## 2014-10-22 NOTE — Telephone Encounter (Signed)
Patient came into office yesterday 10/21/2014 Her  medication reaction to gabapentin was addressed Patient does not have proof of citizenship or social security number So we can not do lyrica through the pass program

## 2014-12-28 ENCOUNTER — Ambulatory Visit: Payer: Self-pay | Attending: Internal Medicine | Admitting: Internal Medicine

## 2014-12-28 ENCOUNTER — Encounter: Payer: Self-pay | Admitting: Internal Medicine

## 2014-12-28 VITALS — BP 137/81 | HR 71 | Temp 98.0°F | Resp 16 | Wt 202.8 lb

## 2014-12-28 DIAGNOSIS — K0889 Other specified disorders of teeth and supporting structures: Secondary | ICD-10-CM

## 2014-12-28 DIAGNOSIS — R202 Paresthesia of skin: Secondary | ICD-10-CM | POA: Insufficient documentation

## 2014-12-28 DIAGNOSIS — K088 Other specified disorders of teeth and supporting structures: Secondary | ICD-10-CM | POA: Insufficient documentation

## 2014-12-28 DIAGNOSIS — R7301 Impaired fasting glucose: Secondary | ICD-10-CM | POA: Insufficient documentation

## 2014-12-28 DIAGNOSIS — R200 Anesthesia of skin: Secondary | ICD-10-CM

## 2014-12-28 NOTE — Progress Notes (Signed)
Patient here with interpreter Complains of still having numbness and tingling to her right side Has a strange sensation going on with her teeth on the right side of her mouth States medication was helping but stopped taking it a few weeks ago Symptoms more prominent when using perfumed products

## 2014-12-28 NOTE — Progress Notes (Signed)
MRN: [id number] Name: Sierra Novak  Sex: female Age: 38 y.o. DOB: 21-Jun-1976  Allergies: Review of patient's allergies indicates no known allergies.  Chief Complaint  Patient presents with  . Follow-up    HPI: Patient is 38 y.o. female who comes today for followup, she reported to have numbness tingling on the right side of her face, on the last visit she was prescribed Neurontin as per patient it helped her with the symptoms but caused her dizziness so she has been taking on and off and has not taken for the last 3 weeks, she also reported to have dental pain/increased sensitivity, denies any fever chills sore throat , patient has not seen a dentist for several years, she also had a blood work done noticed to have impaired fasting glucose.  No past medical history on file.  Past Surgical History  Procedure Laterality Date  . Cesarean section    . Cholecystectomy        Medication List       This list is accurate as of: 12/28/14 11:42 AM.  Always use your most recent med list.               gabapentin 100 MG capsule  Commonly known as:  NEURONTIN  Take 1 capsule (100 mg total) by mouth 3 (three) times daily.     Vitamin D (Ergocalciferol) 50000 UNITS Caps capsule  Commonly known as:  DRISDOL  Take 1 capsule (50,000 Units total) by mouth every 7 (seven) days.        No orders of the defined types were placed in this encounter.    Immunization History  Administered Date(s) Administered  . Influenza,inj,Quad PF,36+ Mos 09/28/2014    Family History  Problem Relation Age of Onset  . Hypertension Father     History  Substance Use Topics  . Smoking status: Never Smoker   . Smokeless tobacco: Not on file  . Alcohol Use: No    Review of Systems   As noted in HPI  Filed Vitals:   12/28/14 1103  BP: 137/81  Pulse: 71  Temp: 98 F (36.7 C)  Resp: 16    Physical Exam  Physical Exam  Constitutional: No distress.  Eyes: EOM are normal.  Pupils are equal, round, and reactive to light.  Neck: Neck supple.  Cardiovascular: Normal rate and regular rhythm.   Pulmonary/Chest: Breath sounds normal. No respiratory distress. She has no wheezes. She has no rales.    CBC    Component Value Date/Time   WBC 6.1 03/22/2014 1121   RBC 4.47 03/22/2014 1121   HGB 13.6 03/22/2014 1121   HCT 40.5 03/22/2014 1121   PLT 308 03/22/2014 1121   MCV 90.6 03/22/2014 1121   LYMPHSABS 2.2 03/22/2014 1121   MONOABS 0.5 03/22/2014 1121   EOSABS 0.2 03/22/2014 1121   BASOSABS 0.0 03/22/2014 1121    CMP     Component Value Date/Time   NA 138 09/28/2014 1057   K 4.0 09/28/2014 1057   CL 108 09/28/2014 1057   CO2 18* 09/28/2014 1057   GLUCOSE 105* 09/28/2014 1057   BUN 10 09/28/2014 1057   CREATININE 0.63 09/28/2014 1057   CREATININE 0.62 11/03/2007 1103   CALCIUM 9.3 09/28/2014 1057   PROT 7.0 09/28/2014 1057   ALBUMIN 4.3 09/28/2014 1057   AST 19 09/28/2014 1057   ALT 19 09/28/2014 1057   ALKPHOS 54 09/28/2014 1057   BILITOT 0.6 09/28/2014 1057   GFRNONAA >89  09/28/2014 1057   GFRNONAA >60 11/03/2007 1103   GFRAA >89 09/28/2014 1057   GFRAA  11/03/2007 1103    >60        The eGFR has been calculated using the MDRD equation. This calculation has not been validated in all clinical    Lab Results  Component Value Date/Time   CHOL 174 03/22/2014 11:21 AM    No components found for: HGA1C  Lab Results  Component Value Date/Time   AST 19 09/28/2014 10:57 AM    Assessment and Plan  Numbness and tingling Patient will start taking Neurontin at night.  IFG (impaired fasting glucose) Advise patient for low carbohydrate diet, she is given the handout, will repeat Fasting blood chemistry on the next visit  Pain, dental - Plan: Ambulatory referral to Dentistry   Health Maintenance  -Vaccinations:  uptodate with the flu shot   Return in about 3 months (around 03/29/2015) for IFG.  Lorayne Marek, MD

## 2014-12-28 NOTE — Patient Instructions (Signed)
La diabetes mellitus y los alimentos (Diabetes Mellitus and Food) Es importante que controle su nivel de azcar en la sangre (glucosa). El nivel de glucosa en sangre depende en gran medida de lo que usted come. Comer alimentos saludables en las cantidades adecuadas a lo largo del da, aproximadamente a la misma hora todos los das, lo ayudar a controlar su nivel de glucosa en sangre. Tambin puede ayudarlo a retrasar o evitar el empeoramiento de la diabetes mellitus. Comer de manera saludable incluso puede ayudarlo a mejorar el nivel de presin arterial y a alcanzar o mantener un peso saludable.  CMO PUEDEN AFECTARME LOS ALIMENTOS? Carbohidratos Los carbohidratos afectan el nivel de glucosa en sangre ms que cualquier otro tipo de alimento. El nutricionista lo ayudar a determinar cuntos carbohidratos puede consumir en cada comida y ensearle a contarlos. El recuento de carbohidratos es importante para mantener la glucosa en sangre en un nivel saludable, en especial si utiliza insulina o toma determinados medicamentos para la diabetes mellitus. Alcohol El alcohol puede provocar disminuciones sbitas de la glucosa en sangre (hipoglucemia), en especial si utiliza insulina o toma determinados medicamentos para la diabetes mellitus. La hipoglucemia es una afeccin que puede poner en peligro la vida. Los sntomas de la hipoglucemia (somnolencia, mareos y desorientacin) son similares a los sntomas de haber consumido mucho alcohol.  Si el mdico lo autoriza a beber alcohol, hgalo con moderacin y siga estas pautas:  Las mujeres no deben beber ms de un trago por da, y los hombres no deben beber ms de dos tragos por da. Un trago es igual a:  12 onzas (355 ml) de cerveza  5 onzas de vino (150 ml) de vino  1,5onzas (45ml) de bebidas espirituosas  No beba con el estmago vaco.  Mantngase hidratado. Beba agua, gaseosas dietticas o t helado sin azcar.  Las gaseosas comunes, los jugos y  otros refrescos podran contener muchos carbohidratos y se deben contar. QU ALIMENTOS NO SE RECOMIENDAN? Cuando haga las elecciones de alimentos, es importante que recuerde que todos los alimentos son distintos. Algunos tienen menos nutrientes que otros por porcin, aunque podran tener la misma cantidad de caloras o carbohidratos. Es difcil darle al cuerpo lo que necesita cuando consume alimentos con menos nutrientes. Estos son algunos ejemplos de alimentos que debera evitar ya que contienen muchas caloras y carbohidratos, pero pocos nutrientes:  Grasas trans (la mayora de los alimentos procesados incluyen grasas trans en la etiqueta de Informacin nutricional).  Gaseosas comunes.  Jugos.  Caramelos.  Dulces, como tortas, pasteles, rosquillas y galletas.  Comidas fritas. QU ALIMENTOS PUEDO COMER? Consuma alimentos ricos en nutrientes, que nutrirn el cuerpo y lo mantendrn saludable. Los alimentos que debe comer tambin dependern de varios factores, como:  Las caloras que necesita.  Los medicamentos que toma.  Su peso.  El nivel de glucosa en sangre.  El nivel de presin arterial.  El nivel de colesterol. Tambin debe consumir una variedad de alimentos, como:  Protenas, como carne, aves, pescado, tofu, frutos secos y semillas (las protenas de animales magros son mejores).  Frutas.  Verduras.  Productos lcteos, como leche, queso y yogur (descremados son mejores).  Panes, granos, pastas, cereales, arroz y frijoles.  Grasas, como aceite de oliva, margarina sin grasas trans, aceite de canola, aguacate y aceitunas. TODOS LOS QUE PADECEN DIABETES MELLITUS TIENEN EL MISMO PLAN DE COMIDAS? Dado que todas las personas que padecen diabetes mellitus son distintas, no hay un solo plan de comidas que funcione para todos. Es muy   importante que se rena con un nutricionista que lo ayudar a crear un plan de comidas adecuado para usted. Document Released: 03/25/2008  Document Revised: 12/22/2013 [employer] Patient Information 2015 [employer]. This information is not intended to replace advice given to you by your health care provider. Make sure you discuss any questions you have with your health care provider.  

## 2015-01-12 ENCOUNTER — Ambulatory Visit: Payer: Self-pay | Admitting: Internal Medicine

## 2015-01-26 ENCOUNTER — Encounter: Payer: Self-pay | Admitting: Gynecology

## 2015-01-26 ENCOUNTER — Ambulatory Visit (INDEPENDENT_AMBULATORY_CARE_PROVIDER_SITE_OTHER): Payer: Self-pay | Admitting: Gynecology

## 2015-01-26 VITALS — BP 140/92 | Ht 63.0 in | Wt 202.0 lb

## 2015-01-26 DIAGNOSIS — I10 Essential (primary) hypertension: Secondary | ICD-10-CM

## 2015-01-26 DIAGNOSIS — N979 Female infertility, unspecified: Secondary | ICD-10-CM

## 2015-01-26 MED ORDER — DOXYCYCLINE HYCLATE 100 MG PO CAPS: 100.0000 mg | ORAL_CAPSULE | Freq: Two times a day (BID) | ORAL | Status: DC

## 2015-01-26 NOTE — Patient Instructions (Addendum)
Espermograma (Semen Analysis) El espermograma investiga algunos aspectos de la salud de los rganos reproductivos masculinos (testculos), incluidas las posibles causas de esterilidad. Adems, este anlisis puede realizarse para determinar si una vasectoma realizada con anterioridad fue satisfactoria.  El semen es una secrecin blanquecina que se expulsa desde el pene durante la fase final del orgasmo Market researcher) y cerca del final de la relacin sexual o la masturbacin. Est compuesto por lquidos y nutrientes de la prstata, las vesculas seminales y otras glndulas. Tambin contiene espermatozoides de los testculos. Un solo espermatozoide contiene una serie completa del cdigo gentico de un hombre (cromosoma).  PREPARACIN PARA EL ESTUDIO Se tomar Truddie Coco de semen en un recipiente estril de vidrio provisto por el laboratorio, o puede recogerse en un preservativo que no contenga lubricantes ni sustancias qumicas, como espermicidas.  RESULTADOS  Es su responsabilidad retirar el resultado del [place]. Consulte en el laboratorio o en el departamento en el que fue realizado el estudio cundo y cmo podr [employer]. Comunquese con el mdico si tiene [employer].  Rango de valores normales  Volumen: 2 a 33m.  Tiempo de licuefaccin: 20 a [id number] de la recoleccin.  Aspecto: normal.  Movilidad/ml: mayor o igual que [id number]  Espermatozoides/ml: mayor o igual que [id number]  Viscosidad: mayor o igual que 3.  Aglutinacin: mayor o igual que 3.  Supravital: mayor o igual que el 75% vivos.  Fructosa: positivo.  pH: 7,12 a 8,00.  Recuento de espermatozoides (densidad): mayor o igual que 256mlones/ml.  Movilidad espermtica: mayor o igual que el 50% a la hora.  Morfologa de los espermatozoides: Mayor que el 30% (criterio de [place] mayor que el 14%) de formas normales. Los rangos para los resultados normales pueden variar  entre diferentes laboratorios y hospitales. Consulte siempre con su mdico despus de haPsychologist, counsellingstudio para coArmed forces logistics/support/administrative officerignificado de los re[place] si los valores se consideran "dentro de los lmites normales". Significado de los reReynolds Americanl bajo nmero de espermatozoides, su movimiento anormal (movilidad) o su forma anormal (morfologa) pueden causar problemas de esterilidad masculina. El mdico leDu Pont haElectrical engineeron usted sobre la importancia y el significado de los reTananaas como las opciones de tratamiento y la necesidad de reOptometristruebas adicionales, si fuera necesario.  Document Released: 10/07/2013 Document Revised: 05/03/2014 ExBlue [facility] Surgery Centeratient Information 2015 ExYuccaThis information is not intended to replace advice given to you by your health care provider. Make sure you discuss any questions you have with your health care provider. Histerosalpingografa (Hysterosalpingography) La histerosalpingografa es un procedimiento para observar el interior del tero y las trompas de FaCarringtonDurante este procedimiento, se inyecta una sustancia de contraste en el tero, a travs de la vagina y el cuello del tero para poder visualizar el tero mientras se toman imgenes radiogrficas. Este procedimiento puede ayudar al mdico a diagnosticar tumores, adherencias o anormalidades estructurales en el tero. Generalmente, se utSouth Georgia and the South Sandwich Islandsara determinar las razones por las que una mujer no puede tener hijos (infertilidad). El procedimiento suele durar entre 15 y 3054inutos. INFORME A SU MDICO:  Cualquier alergia que tenga.  Todos los meLyondell Chemicalincluidos vitaminas, hierbas, gotas oftlmicas, cremas y medicamentos de venta libre.  Problemas previos que usted o los miUnitedHealthe su familia hayan tenido con el uso de anestsicos.  Enfermedades de laCampbell Soup Cirugas previas.  Afecciones mdicas que tenga. RIESGOS Y COMPLICACIONES  En general,  se trata de un procedimiento seguro.  Sin embargo, como en cualquier procedimiento, pueden surgir problemas. Estos son algunos posibles problemas:  Infeccin en el revestimiento interior del tero (endometritis) o en las trompas de Falopio (salpingitis).  Dao o perforacin del tero o las trompas de Falopio.  Reaccin alrgica a la sustancia de contraste utilizada para tomar la radiografa. ANTES DEL PROCEDIMIENTO   Debe planificar el procedimiento para despus que finalice su perodo, pero antes de su prxima ovulacin. Esto ocurre por lo general entre los das 5 y 10 de su ltimo perodo. El Da 1 es el primer da de su perodo.  Consulte a su mdico si debe cambiar o suspender los medicamentos que toma habitualmente.  Podr comer y beber normalmente.  Antes de comenzar el procedimiento, vace la vejiga. PROCEDIMIENTO  Es posible que le administren un medicamento para relajarse (sedante) o un analgsico de venta libre para aliviar las molestias durante el procedimiento.  Deber acostarse en una mesa de radiografas con los pies en los estribos.  Le colocarn en la vagina un dispositivo llamado espculo. Esto permite al mdico observar el interior de la vagina hasta el cuello del tero.  El cuello del tero se lava con un jabn especial.  Luego se pasa un tubo delgado y flexible a travs del cuello del tero hasta el tero.  Por el tubo se colocar una sustancia de contraste.  Le tomarn varias radiografas a medida que el contraste pasa a travs del tero y las trompas de Falopio.  Despus del procedimiento se retira el tubo. DESPUS DEL PROCEDIMIENTO   La mayor parte de la sustancia de contraste se eliminar de la vagina naturalmente. Puede ser necesario que use un apsito sanitario.  Puede sentir clicos leves y notar una hemorragia vaginal leve. Luego de 24 horas deben desaparecer.  Pregunte cundo estarn listos los resultados. Asegrese de obtener los  resultados. Document Released: 03/10/2012 Document Revised: 12/22/2013 ExitCare Patient Information 2015 ExitCare, LLC. This information is not intended to replace advice given to you by your health care provider. Make sure you discuss any questions you have with your health care provider. Infertilidad (Infertility) QU ES LA INFERTILIDAD?  La infertilidad normalmente se define como la incapacidad para quedar embarazada luego de un ao de relaciones sexuales regulares sin la utilizacin de mtodos anticonceptivos. O la incapacidad de llevar a trmino un embarazo y tener el beb. La tasa de infertilidad en los [place] es de alrededor del 10%. El embarazo es el resultado de una cadena de sucesos. La mujer debe liberar el vulo de uno de sus ovarios (ovulacin). El vulo debe fertilizarse con el esperma. Luego viaja a travs de las trompas de Falopio hacia el tero (matriz), donde se une a la pared del tero y crece. El hombre debe tener suficiente esperma y el esperma debe unirse con el vulo (fertilizar) en el momento justo. El vulo fertilizado debe luego unirse al interior del tero. Esto parece simple, pero pueden ocurrir muchas cosas que evitan que el embarazo se produzca.  DE QUIN ES EL PROBLEMA?  Un 20% de los casos de infertilidad se deben a problemas del hombres (factores masculinos) y un 65% se debe a problemas de la mujer (factores femeninos). Otras causas pueden ser una combinacin de factores masculinos y femeninos o a causas desconocidas.  CULES SON LAS CAUSAS DE LA INFERTILIDAD EN EL HOMBRE?  La infertilidad en el hombre a menudo se debe a problemas para producir el esperma o hacer que el mismo llegue al vulo. Los problemas de   esperma pueden existir desde el nacimiento o desarrollarse ms tarde debido a enfermedades o lesiones. Algunos hombres no producen esperma, o producen muy poco (oligospermia). Entre otros problemas se incluyen:  Disfuncin sexual  Problemas hormonales  o endocrinos.  La edad. La fertilidad del hombre disminuye con la edad, pero no tan temprano como la femenina.  Infecciones.  Problemas congnitos Defectos de nacimiento, como ausencia de los tubos que transportan el esperma (conductos deferentes).  Problemas genticos o de cromosomas.  Problemas de anticuerpos antiesperma.  Eyaculacin retrgrada (el esperma va hacia la vejiga).  Varicoceles, espermatoceles o tumores en los testculos.  El estilo de vida puede influir en el nmero y la calidad del esperma del hombre.  El alcohol y las drogas pueden reducir temporalmente la calidad del esperma.  Las toxinas ambientales, como los pesticidas y el plomo, pueden ocasionar algunos casos de infertilidad en hombres. CULES SON LAS CAUSAS DE LA INFERTILIDAD EN LA MUJER?   La infertilidad en las mujeres est causada mayormente por problemas con la ovulacin. Sin ovulacin, el vulo no puede fertilizarse.  Seales de problemas de ovulacin son perodos menstruales irregulares o ningn perodo.  Factores simples del estilo de vida, como el estrs, la dieta, o el entrenamiento deportivo, pueden afectar el balance hormonal de Arts administrator.  La edad. La fertilidad comienza a Chiropractor alrededor de los 30 aos y [place] a Proofreader de los 23.  Mucho menos a menudo, un desequilibrio hormonal por un problema mdico serio como un tumor en la glndula pituitaria, tiroides u otra enfermedad mdica crnica pueden ocasionar problemas de ovulacin.  Infecciones plvicas.  Sndrome de ovarios poliqusticos (aumento de hormonas masculinas, incapacidad de ovular).  Consumo de alcohol o drogas.  Toxinas ambientales, radiacin, pesticidas y ciertos qumicos.  La edad es un factor importante en la infertilidad femenina.  La capacidad de los ovarios de una mujer para producir vulos disminuye con la edad, especialmente despus de los 52 aos. Alrededor de un tercio de las parejas en las que  la mujer tiene ms de 35 aos tendr problemas de infertilidad.  En el momento en el que alcanza la [place], cuando su perodo menstrual se detiene, la mujer ya no puede producir vulos ni quedar embarazada.  Otros problemas tambin pueden llevar a la infertilidad en las mujeres. Si las trompas de [employer] estn bloqueadas en [employer] extremos, el vulo no puede pasar a travs de los tubos [place]. Tejido cicatrizal (adhesiones) en la pelvis que pueden obstruir las trompas. Esto puede dar como resultado una enfermedad inflamatoria plvica, endometriosis o una ciruga por embarazo ectpico (en la que el vulo fertilizado se ha implantado fuera del tero) o cualquier ciruga plvica o abdominal que ocasione adherencias.  Tumores fibroides o plipos en el tero.  Anormalidades congnitas (de nacimiento) del tero.  Infecciones en el cuello del tero (cervicitis).  Estenosis cervical (estrechamiento).  Mucosidad cervical anormal.  Sndrome poliqustico de los ovarios.  Tener relaciones sexuales muy seguidas ([place] 4 a 5 veces por semana).  Obesidad.  Anorexia.  Dficit nutricional.  Mucho ejercicio, con prdida de grasa corporal.  DES. Su madre ha recibido la hormona dietilstilbesterol cuando estaba embarazada de usted. CMO SE ANALIZA LA INFERTILIDAD?  Si ha estado tratando de quedar embarazada sin xito, podra querer buscar ayuda mdica. No debera esperar un ao de intentos sin xito antes de buscar ayuda profesional si:  Tiene mas de 35 aos de edad  Tiene razones para  creer que podra tener problemas de fertilidad. Un examen mdico determinar las causas de infertilidad de la pareja, Normalmente el proceso comienza con:  Exmenes fsicos  Historias clnicas de ambos miembros de la pareja.  Historiales sexuales de ambos miembros de la pareja. Si no hay un problema evidente, como relaciones sexuales en tiempos inadecuados o ausencia de ovulacin, se  necesitarn Training and development officer.   Para el hombre, los anlisis normalmente comienzan con pruebas de semen para observar:  La cantidad de esperma.  La forma del esperma.  El movimiento del esperma.  Realizar un historial clnico y quirrgico completo.  Examen fsico.  Control de infecciones en los rganos reproductivos. Podrn realizarle anlisis hormonales.   Para la mujer, el primer paso del anlisis es saber si ovula cada mes. Hay diferentes modos de Secondary school teacher. Por ejemplo, puede llevar un registro de los cambios en la temperatura corporal por la maana y la textura de la mucosidad cervical. Otra herramienta es un kit de prueba de ovulacin casero, que puede comprarse en la farmacia.  Tambin pueden realizarse controles de ovulacin en el consultorio mdico, mediante anlisis de sangre para observar los niveles de hormonas o pruebas de [employer] ovarios. Si la mujer est ovulando, se necesitarn realizar ms exmenes. Algunas femeninas comunes incluyen:  Histerosalpingografa: Se realizan rayos x de las trompas de Falopio y el tero con una inyeccin de [place]. Mostrar si las trompas estn abiertas y la forma del tero.  Laparoscopa: Un anlisis de las trompas y otros rganos femeninos para Risk manager. Se utiliza un tubo con iluminacin llamado laparoscopio para observar el interior del abdomen.  Biopsia endometrial: Se toma una muestra del tejido del tero Engineer, manufacturing systems del perodo menstrual, para ver si el tejido le indica si est ovulando.  Prueba de ultrasonido transvaginal: Examina los rganos femeninos.  Histeroscopa: Utiliza un tubo con iluminacin para examinar el cuello del tero y el tero y ver si hay anormalidades dentro del mismo. TRATAMIENTO Dependiendo de los [employer], se sugerirn Engineer, technical sales. El tratamiento depende de la causa. Entre el 35 y el 90% de los casos de infertilidad se tratan con drogas o [place].    Hay varias drogas para la fertilidad que pueden utilizarse para las mujeres con problemas de ovulacin. Es importante hablar con el profesional que la asiste sobre la droga a Risk manager. Deber comprender los beneficios y [place] drogas. Segn el tipo de droga para la fertilidad y la dosis [place], algunas mujeres podran tener embarazos mltiples (mellizos).  De ser [employer], se puede realizar una ciruga para reparar daos en los ovarios de la [place], trompas de [place], el cuello del tero o el tero.  Tratamiento quirrgico o mdico para la endometriosis o el sndrome de ovario poliqustico. A veces, los problemas de fertilidad en el hombre pueden corregirse con medicamentos o [place].  Inseminacin intrauterina, IUI, de esperma en concordancia con la ovulacin.  Cambios en el estilo de vida, si esta es la causa (prdida de [place], aumento de ejercicios, dejar de fumar, beber excesivamente o tomar drogas ilegales).  Otros tipos de ciruga:  Extirpar tumores por dentro o sobre el tero.  Eliminar tejido cicatrizal de dentro del tero.  Arreglar trompas obstruidas.  Eliminar tejido cicatrizal en la pelvis y alrededor de los rganos femeninos. QU ES LA TECNOLOGA DE REPRODUCCIN ASISTIDA (ART)?  La tecnologa de reproduccin asistida (ART) utiliza mtodos especiales para ayudar a parejas infrtiles. En esta tcnica se manipula tanto el  vulo de la mujer como el esperma del hombre. El xito depende de muchos factores. La ART puede ser cara y demandar mucho tiempo. Pero ha hecho posible que muchas parejas tengan hijos que de otra manera no hubieran podido. A continuacin se enumeran algunos mtodos:  Fertilizacin. In Vitro (FIV). In Vitro (IVF) es un procedimiento que se hizo famoso con el nacimiento en [place], el primer "beb de probeta" del mundo. Se utiliza cuando las trompas de [employer] de la mujer estn bloqueadas o cuando el hombre tiene poco esperma.  Se utiliza una droga para estimular los ovarios y producir mltiples vulos. Una vez maduros, los vulos se retiran y se Copywriter, advertising en una placa de cultivo con el esperma del hombre para la fertilizacin. Luego de aproximadamente 40 horas, los vulos se examinan para ver si han sido fertilizados por el esperma y se han dividido en clulas. Esos vulos fertilizados (embriones) se colocan luego en el tero de la mujer. Esto evita el paso por las trompas de [place].  La transferencia intrafalopiana de gametas (GIFT) es similar al IVF, pero se utiliza cuando la mujer tiene al menos una trompa de falopio normal. Se colocan de tres a cinco vulos en la trompa de Falopio, junto con el esperma del hombre, para que la fertilizacin se realice dentro del cuerpo de Retail banker.  La transferencia intrafalopiana de cigotos (ZIFT), tambin se denomina transferencia embrionaria, y [place] el IVF con el GIFT. Los vulos retirados de los ovarios de la mujer se Health visitor laboratorio y se Copywriter, advertising en las trompas de [employer] en lugar de en el tero.  Los procedimientos de ART a menudo suponen la utilizacin de donantes de vulos (de [place] mujer) o de embriones congelados previamente. Los donantes de vulos pueden utilizarse si la mujer tiene [employer] Scientific ovarios o posee una enfermedad gentica que podra transmitirla al beb.  Cuando se realiza una ART hay mayor riesgo de embarazos mltiples, mellizos, trillizos o ms.  La inyeccin de esperma intracitoplasmtica es un procedimiento que inyecta un espermatozoide en el vulo para fertilizarlo.  El trasplante embrionario es un procedimiento que comienza con un embrin que se ha desarrollado en un medio especial (solucin qumica) preparado para mantener el embrin vivo por 2 a 5 das, y Heritage manager. En los [employer] que no puede encontrarse la causa y Water quality scientist no se produce, podr considerarse la adopcin. Document Released: 01/06/2008 Document  Revised: 03/10/2012 [facility] Patient Information 2015 [place]. This information is not intended to replace advice given to you by your health care provider. Make sure you discuss any questions you have with your health care provider.

## 2015-01-26 NOTE — Progress Notes (Signed)
    Patient is a 39 year old gravida 5 para 2 (twins) Ab4 who has not been seen the office in over 7 years. Patient conceived last pregnancy with clomiphene citrate and timing of intercourse. She had twin gestation who delivered via C-section during her third trimester that she does not recall the number weeks. She is here to discuss the possibility of getting pregnant again. She has not been using any form of contraception. She reports normal menstrual cycles. No change in partner. Her husband was diagnosed with diabetes. Patient been receiving [place] her last exam was in March 2015 reported to be normal. She is currently on no medication except Neurontin.  We had a lengthy discussion Spanish in reference to advanced maternal age with potential issues such as hypertension and diabetes as well as premature delivery. We also discussed increased risk of aneuploidy and birth defects as well as premature delivery. Patients going to discuss these issues with her spouse I provided her with literature information. If she wants to move forward the first up would be to proceed with an HSG along with semen analysis. She's currently on day 6 of her cycle and we'll schedule an HSG within the next few days. She will be prescribed Vibramycin 100 mg take 1 by mouth twice a day for 3 days starting the day before procedure. She will then make an informed to see me in consultation the week after. She'll begin her prenatal vitamin.

## 2015-04-07 ENCOUNTER — Encounter: Payer: Self-pay | Admitting: Internal Medicine

## 2015-04-07 ENCOUNTER — Ambulatory Visit: Payer: Self-pay | Attending: Internal Medicine | Admitting: Internal Medicine

## 2015-04-07 VITALS — BP 129/86 | HR 66 | Temp 98.0°F | Resp 16 | Wt 200.0 lb

## 2015-04-07 DIAGNOSIS — Z9049 Acquired absence of other specified parts of digestive tract: Secondary | ICD-10-CM | POA: Insufficient documentation

## 2015-04-07 DIAGNOSIS — Z91048 Other nonmedicinal substance allergy status: Secondary | ICD-10-CM | POA: Insufficient documentation

## 2015-04-07 DIAGNOSIS — R7301 Impaired fasting glucose: Secondary | ICD-10-CM | POA: Insufficient documentation

## 2015-04-07 DIAGNOSIS — Z124 Encounter for screening for malignant neoplasm of cervix: Secondary | ICD-10-CM | POA: Insufficient documentation

## 2015-04-07 DIAGNOSIS — E559 Vitamin D deficiency, unspecified: Secondary | ICD-10-CM | POA: Insufficient documentation

## 2015-04-07 LAB — COMPLETE METABOLIC PANEL WITH GFR
ALT: 16 U/L (ref 0–35)
AST: 18 U/L (ref 0–37)
Albumin: 4.1 g/dL (ref 3.5–5.2)
Alkaline Phosphatase: 45 U/L (ref 39–117)
BILIRUBIN TOTAL: 0.8 mg/dL (ref 0.2–1.2)
BUN: 9 mg/dL (ref 6–23)
CALCIUM: 9.1 mg/dL (ref 8.4–10.5)
CHLORIDE: 107 meq/L (ref 96–112)
CO2: 23 meq/L (ref 19–32)
CREATININE: 0.52 mg/dL (ref 0.50–1.10)
GFR, Est Non African American: >89 mL/min
Glucose, Bld: 86 mg/dL (ref 70–99)
Potassium: 4.7 mEq/L (ref 3.5–5.3)
SODIUM: 138 meq/L (ref 135–145)
TOTAL PROTEIN: 6.9 g/dL (ref 6.0–8.3)

## 2015-04-07 LAB — HEMOGLOBIN A1C
HEMOGLOBIN A1C: 5.7 % — AB (ref <5.7)
Mean Plasma Glucose: 117 mg/dL — ABNORMAL HIGH (ref <117)

## 2015-04-07 NOTE — Progress Notes (Signed)
In house interpreter used Patient states she is here for her routine three month check up Did see the dentist and had a couple of fillings taken care of Patient also states she has an allergy to clorox and incents

## 2015-04-07 NOTE — Progress Notes (Signed)
MRN: [id number] Name: Sierra Novak  Sex: female Age: 39 y.o. DOB: 06/02/1976  Allergies: Review of patient's allergies indicates no known allergies.  Chief Complaint  Patient presents with  . Follow-up    HPI: Patient is 39 y.o. female who has history of impaired fasting glucose, vitamin D deficiency comes today for followup , she reported to have allergy to Clorox products and as per patient she is trying to avoid those products, she also recently had seen a dentist and had fillings done, patient would like to do blood work today and would like to have a Pap smear done.  History reviewed. No pertinent past medical history.  Past Surgical History  Procedure Laterality Date  . Cesarean section    . Cholecystectomy        Medication List       This list is accurate as of: 04/07/15 12:40 PM.  Always use your most recent med list.               doxycycline 100 MG capsule  Commonly known as:  VIBRAMYCIN  Take 1 capsule (100 mg total) by mouth 2 (two) times daily. Take one tablet twice a dy starting the day before procedure.     gabapentin 100 MG capsule  Commonly known as:  NEURONTIN  Take 1 capsule (100 mg total) by mouth 3 (three) times daily.     Vitamin D (Ergocalciferol) 50000 UNITS Caps capsule  Commonly known as:  DRISDOL  Take 1 capsule (50,000 Units total) by mouth every 7 (seven) days.        No orders of the defined types were placed in this encounter.    Immunization History  Administered Date(s) Administered  . Influenza,inj,Quad PF,36+ Mos 09/28/2014    Family History  Problem Relation Age of Onset  . Hypertension Father   . Diabetes Paternal Aunt     History  Substance Use Topics  . Smoking status: Never Smoker   . Smokeless tobacco: Not on file  . Alcohol Use: No    Review of Systems  HENT: Negative for congestion and rhinorrhea.   Respiratory: Negative for cough and wheezing.      As noted in HPI  Filed Vitals:   04/07/15 1208  BP: 129/86  Pulse: 66  Temp: 98 F (36.7 C)  Resp: 16    Physical Exam  Physical Exam  Constitutional: No distress.  Eyes: EOM are normal. Pupils are equal, round, and reactive to light.  Cardiovascular: Normal rate and regular rhythm.   Pulmonary/Chest: Breath sounds normal. No respiratory distress. She has no wheezes. She has no rales.  Musculoskeletal: She exhibits no edema.    CBC    Component Value Date/Time   WBC 6.1 03/22/2014 1121   RBC 4.47 03/22/2014 1121   HGB 13.6 03/22/2014 1121   HCT 40.5 03/22/2014 1121   PLT 308 03/22/2014 1121   MCV 90.6 03/22/2014 1121   LYMPHSABS 2.2 03/22/2014 1121   MONOABS 0.5 03/22/2014 1121   EOSABS 0.2 03/22/2014 1121   BASOSABS 0.0 03/22/2014 1121    CMP     Component Value Date/Time   NA 138 09/28/2014 1057   K 4.0 09/28/2014 1057   CL 108 09/28/2014 1057   CO2 18* 09/28/2014 1057   GLUCOSE 105* 09/28/2014 1057   BUN 10 09/28/2014 1057   CREATININE 0.63 09/28/2014 1057   CREATININE 0.62 11/03/2007 1103   CALCIUM 9.3 09/28/2014 1057   PROT 7.0 09/28/2014 1057  ALBUMIN 4.3 09/28/2014 1057   AST 19 09/28/2014 1057   ALT 19 09/28/2014 1057   ALKPHOS 54 09/28/2014 1057   BILITOT 0.6 09/28/2014 1057   GFRNONAA >89 09/28/2014 1057   GFRNONAA >60 11/03/2007 1103   GFRAA >89 09/28/2014 1057   GFRAA  11/03/2007 1103    >60        The eGFR has been calculated using the MDRD equation. This calculation has not been validated in all clinical    Lab Results  Component Value Date/Time   CHOL 174 03/22/2014 11:21 AM    No components found for: HGA1C  Lab Results  Component Value Date/Time   AST 19 09/28/2014 10:57 AM    Assessment and Plan  IFG (impaired fasting glucose) - Plan: advised patient for low carbohydrate diet, will repeat COMPLETE METABOLIC PANEL WITH GFR, and also check Hemoglobin A1c  Vitamin D deficiency - Plan: Vit D  25 hydroxy (rtn osteoporosis monitoring)   Health  Maintenance -Pap Smear:will be scheduled   Return in about 6 months (around 10/07/2015) for Schedule Appt with Dr Burman Freestone for PAP.   This note has been created with Surveyor, quantity. Any transcriptional errors are unintentional.    Lorayne Marek, MD

## 2015-04-08 ENCOUNTER — Telehealth: Payer: Self-pay

## 2015-04-08 LAB — VITAMIN D 25 HYDROXY (VIT D DEFICIENCY, FRACTURES): Vit D, 25-Hydroxy: 14 ng/mL — ABNORMAL LOW (ref 30–100)

## 2015-04-08 NOTE — Telephone Encounter (Signed)
Pt returning nurse's call, please f/u with pt.   °

## 2015-04-08 NOTE — Telephone Encounter (Signed)
Belen-interpreter used Patient not available Left message on voice mail to return our call

## 2015-04-08 NOTE — Telephone Encounter (Signed)
-----   Message from Doris Cheadleeepak Advani, MD sent at 04/08/2015 11:09 AM EDT ----- Blood work reviewed, noticed low vitamin D, call patient advise to start ergocalciferol 50,000 units once a week for the duration of  12 weeks, then take OTC vitamin d 2000 units daily. Also her blood sugar is borderline elevated with hemoglobin A1c of 5.7%, advised patient to continue with low carbohydrate diet.

## 2015-04-11 NOTE — Telephone Encounter (Signed)
Pt returning nurse's call, please f/u with pt using interpreter.  °

## 2015-04-14 ENCOUNTER — Telehealth: Payer: Self-pay

## 2015-04-14 NOTE — Telephone Encounter (Signed)
Returned patient phone call Patient not available Left message on voice mail to return our call 

## 2015-04-25 ENCOUNTER — Other Ambulatory Visit: Payer: Self-pay | Admitting: Internal Medicine

## 2015-04-29 ENCOUNTER — Encounter: Payer: Self-pay | Admitting: Internal Medicine

## 2015-04-29 ENCOUNTER — Ambulatory Visit: Payer: Self-pay | Attending: Internal Medicine | Admitting: Internal Medicine

## 2015-04-29 VITALS — BP 115/80 | HR 62 | Temp 97.7°F | Resp 16 | Ht 63.0 in | Wt 200.0 lb

## 2015-04-29 DIAGNOSIS — Z01411 Encounter for gynecological examination (general) (routine) with abnormal findings: Secondary | ICD-10-CM | POA: Insufficient documentation

## 2015-04-29 DIAGNOSIS — B999 Unspecified infectious disease: Secondary | ICD-10-CM | POA: Insufficient documentation

## 2015-04-29 DIAGNOSIS — B351 Tinea unguium: Secondary | ICD-10-CM | POA: Insufficient documentation

## 2015-04-29 DIAGNOSIS — Z124 Encounter for screening for malignant neoplasm of cervix: Secondary | ICD-10-CM

## 2015-04-29 DIAGNOSIS — A499 Bacterial infection, unspecified: Secondary | ICD-10-CM

## 2015-04-29 DIAGNOSIS — B9689 Other specified bacterial agents as the cause of diseases classified elsewhere: Secondary | ICD-10-CM

## 2015-04-29 DIAGNOSIS — N760 Acute vaginitis: Secondary | ICD-10-CM | POA: Insufficient documentation

## 2015-04-29 MED ORDER — METRONIDAZOLE 500 MG PO TABS: 500.0000 mg | ORAL_TABLET | Freq: Two times a day (BID) | ORAL | Status: DC

## 2015-04-29 MED ORDER — TERBINAFINE HCL 1 % EX CREA: 1.0000 "application " | TOPICAL_CREAM | Freq: Two times a day (BID) | CUTANEOUS | Status: DC

## 2015-04-29 NOTE — Patient Instructions (Signed)
Vaginosis bacteriana (Bacterial Vaginosis) La vaginosis bacteriana es una infeccin vaginal que perturba el equilibrio normal de las bacterias que se encuentran en la vagina. Es el resultado de un crecimiento excesivo de ciertas bacterias. Esta es la infeccin vaginal ms frecuente en mujeres en edad reproductiva. El tratamiento es importante para prevenir complicaciones, especialmente en mujeres embarazadas, dado que puede causar un parto prematuro. CAUSAS  La vaginosis bacteriana se origina por un aumento de bacterias nocivas que, generalmente, estn presentes en cantidades ms pequeas en la vagina. Varios tipos diferentes de bacterias pueden causar esta afeccin. Sin embargo, la causa de su desarrollo no se comprende totalmente. FACTORES DE RIESGO Ciertas actividades o comportamientos pueden exponerlo a un mayor riesgo de desarrollar vaginosis bacteriana, entre los que se incluyen:  Tener una nueva pareja sexual o mltiples parejas sexuales.  Las duchas vaginales  El uso del DIU (dispositivo intrauterino) como mtodo anticonceptivo. El contagio no se produce en baos, por ropas de cama, en piscinas o por contacto con objetos. SIGNOS Y SNTOMAS  Algunas mujeres que padecen vaginosis bacteriana no presentan signos ni sntomas. Los sntomas ms comunes son:  Secrecin vaginal de color grisceo.  Secrecin vaginal con olor similar al pescado, especialmente despus de mantener relaciones sexuales.  Picazn o sensacin de ardor en la vagina o la vulva.  Ardor o dolor al orinar. DIAGNSTICO  Su mdico analizar su historia clnica y le examinar la vagina para detectar signos de vaginosis bacteriana. Puede tomarle una muestra de flujo vaginal. Su mdico examinar esta muestra con un microscopio para controlar las bacterias y clulas anormales. Tambin puede realizarse un anlisis del pH vaginal.  TRATAMIENTO  La vaginosis bacteriana puede tratarse con antibiticos, en forma de comprimidos o  de crema vaginal. Puede indicarse una segunda tanda de antibiticos si la afeccin se repite despus del tratamiento.  INSTRUCCIONES PARA EL CUIDADO EN EL HOGAR   Tome solo medicamentos de venta libre o recetados, segn las indicaciones del mdico.  Si le han recetado antibiticos, tmelos como se le indic. Asegrese de que finaliza la prescripcin completa aunque se sienta mejor.  No mantenga relaciones sexuales hasta completar el tratamiento.  Comunique a sus compaeros sexuales que sufre una infeccin vaginal. Deben consultar a su mdico y recibir tratamiento si tienen problemas, como picazn o una erupcin cutnea leve.  Practique el sexo seguro usando preservativos y tenga un nico compaero sexual. SOLICITE ATENCIN MDICA SI:   Sus sntomas no mejoran despus de 3 das de tratamiento.  Aumenta la secrecin o el dolor.  Tiene fiebre. ASEGRESE DE QUE:   Comprende estas instrucciones.  Controlar su afeccin.  Recibir ayuda de inmediato si no mejora o si empeora. PARA OBTENER MS INFORMACIN  Centros para el control y la prevencin de enfermedades (Centers for Disease Control and Prevention, CDC): www.cdc.gov/std Asociacin [place] de la Salud Sexual (American Sexual Health Association, SHA): www.ashastd.org  Document Released: 03/25/2008 Document Revised: 10/07/2013 ExitCare Patient Information 2015 [employer]. This information is not intended to replace advice given to you by your health care provider. Make sure you discuss any questions you have with your health care provider.  

## 2015-04-29 NOTE — Progress Notes (Signed)
Patient ID: Sierra Novak, female   DOB: 12/04/1976, 39 y.o.   MRN: [id number]  CC: pap smear  HPI: Sierra Novak is a 39 y.o. female here today for a pap smear and breast exam.  She reports that she has been having a vaginal discharge for 2 weeks. She denies itch, odor, or STD exposure. She is concerned about foot fungus on bilateral feet that has been present for several weeks.  Patient has No headache, No chest pain, No abdominal pain - No Nausea, No new weakness tingling or numbness, No Cough - SOB.  No Known Allergies History reviewed. No pertinent past medical history. Current Outpatient Prescriptions on File Prior to Visit  Medication Sig Dispense Refill  . doxycycline (VIBRAMYCIN) 100 MG capsule Take 1 capsule (100 mg total) by mouth 2 (two) times daily. Take one tablet twice a dy starting the day before procedure. (Patient not taking: Reported on 04/29/2015) 6 capsule 0  . gabapentin (NEURONTIN) 100 MG capsule Take 1 capsule (100 mg total) by mouth 3 (three) times daily. (Patient not taking: Reported on 04/29/2015) 90 capsule 3  . Vitamin D, Ergocalciferol, (DRISDOL) 50000 UNITS CAPS capsule Take 1 capsule (50,000 Units total) by mouth every 7 (seven) days. (Patient not taking: Reported on 01/26/2015) 12 capsule 0   No current facility-administered medications on file prior to visit.   Family History  Problem Relation Age of Onset  . Hypertension Father   . Diabetes Paternal Aunt    History   Social History  . Marital Status: Legally Separated    Spouse Name: N/A  . Number of Children: N/A  . Years of Education: N/A   Occupational History  . Not on file.   Social History Main Topics  . Smoking status: Never Smoker   . Smokeless tobacco: Not on file  . Alcohol Use: No  . Drug Use: Not on file  . Sexual Activity: Yes     Comment: 1ST INTERCOURSE- 3023, PARTNERS- 1   Other Topics Concern  . Not on file   Social History Narrative   ** Merged History  Encounter **        Review of Systems: Constitutional: Negative for fever, chills, diaphoresis, activity change, appetite change and fatigue. HENT: Negative for ear pain, nosebleeds, congestion, facial swelling, rhinorrhea, neck pain, neck stiffness and ear discharge.  Eyes: Negative for pain, discharge, redness, itching and visual disturbance. Respiratory: Negative for cough, choking, chest tightness, shortness of breath, wheezing and stridor.  Cardiovascular: Negative for chest pain, palpitations and leg swelling. Gastrointestinal: Negative for abdominal distention. Genitourinary: Negative for dysuria, urgency, frequency, hematuria, flank pain, decreased urine volume, difficulty urinating and dyspareunia.  Musculoskeletal: Negative for back pain, joint swelling, arthralgias and gait problem. Neurological: Negative for dizziness, tremors, seizures, syncope, facial asymmetry, speech difficulty, weakness, light-headedness, numbness and headaches.  Hematological: Negative for adenopathy. Does not bruise/bleed easily. Psychiatric/Behavioral: Negative for hallucinations, behavioral problems, confusion, dysphoric mood, decreased concentration and agitation.    Objective:   Filed Vitals:   04/29/15 1559  BP: 115/80  Pulse: 62  Temp: 97.7 F (36.5 C)  Resp: 16    Physical Exam  Constitutional: She is oriented to person, place, and time.  Cardiovascular: Normal rate, regular rhythm and normal heart sounds.   Pulmonary/Chest: Effort normal and breath sounds normal.  Abdominal: Soft. Bowel sounds are normal.  Genitourinary: Uterus normal. Vaginal discharge found.  Neurological: She is alert and oriented to person, place, and time.  Skin: Skin is warm and  dry.  onychomycosis of BLE     Lab Results  Component Value Date   WBC 6.1 03/22/2014   HGB 13.6 03/22/2014   HCT 40.5 03/22/2014   MCV 90.6 03/22/2014   PLT 308 03/22/2014   Lab Results  Component Value Date   CREATININE  0.52 04/07/2015   BUN 9 04/07/2015   NA 138 04/07/2015   K 4.7 04/07/2015   CL 107 04/07/2015   CO2 23 04/07/2015    Lab Results  Component Value Date   HGBA1C 5.7* 04/07/2015   Lipid Panel     Component Value Date/Time   CHOL 174 03/22/2014 1121   TRIG 117 03/22/2014 1121   HDL 43 03/22/2014 1121   CHOLHDL 4.0 03/22/2014 1121   VLDL 23 03/22/2014 1121   LDLCALC 108* 03/22/2014 1121       Assessment and plan:   [name] was seen today for follow-up.  Diagnoses and all orders for this visit:  Papanicolaou smear Orders: -     Cytology - PAP [place] -     HIV antibody (with reflex) -     RPR -     Cervicovaginal ancillary only -     Cervicovaginal ancillary only  Bacterial vaginal infection Orders: -     metroNIDAZOLE (FLAGYL) 500 MG tablet; Take 1 tablet (500 mg total) by mouth 2 (two) times daily. Vaginal discharge and odor consistent with BV  Onychomycosis Orders: -     terbinafine (LAMISIL AT) 1 % cream; Apply 1 application topically 2 (two) times daily. Will let patient begin using cream twice daily. If it does not resolve over next 3 months she may speak with PCP about other forms of treatment.  Follow up as needed with Advani.     Holland Commons, NP-C [facility] [phone] 04/29/2015, 4:44 PM

## 2015-04-29 NOTE — Progress Notes (Signed)
Pt is here for a pap smear. Interpreter [place].

## 2015-04-30 LAB — RPR

## 2015-04-30 LAB — HIV ANTIBODY (ROUTINE TESTING W REFLEX): HIV 1&2 Ab, 4th Generation: NONREACTIVE

## 2015-05-03 LAB — CERVICOVAGINAL ANCILLARY ONLY
Chlamydia: NEGATIVE
Neisseria Gonorrhea: NEGATIVE
WET PREP (BD AFFIRM): NEGATIVE

## 2015-05-04 LAB — CYTOLOGY - PAP

## 2015-05-05 ENCOUNTER — Telehealth: Payer: Self-pay | Admitting: *Deleted

## 2015-05-05 NOTE — Telephone Encounter (Signed)
Left VM for pt to return my call  

## 2015-05-05 NOTE — Telephone Encounter (Signed)
-----   Message from Ambrose FinlandValerie A Keck, NP sent at 05/05/2015  7:50 AM EDT ----- Negative for diseases but pap was not a good sample. I will need to repeat pap in one year.

## 2016-07-13 ENCOUNTER — Ambulatory Visit: Payer: Self-pay | Attending: Internal Medicine

## 2016-07-16 ENCOUNTER — Ambulatory Visit: Payer: Self-pay

## 2016-07-23 ENCOUNTER — Ambulatory Visit: Payer: Self-pay | Attending: Internal Medicine | Admitting: Physician Assistant

## 2016-07-23 ENCOUNTER — Encounter: Payer: Self-pay | Admitting: Physician Assistant

## 2016-07-23 VITALS — BP 122/85 | HR 59 | Temp 98.1°F | Resp 14 | Ht 63.5 in | Wt 206.4 lb

## 2016-07-23 DIAGNOSIS — Z79899 Other long term (current) drug therapy: Secondary | ICD-10-CM | POA: Insufficient documentation

## 2016-07-23 DIAGNOSIS — R531 Weakness: Secondary | ICD-10-CM | POA: Insufficient documentation

## 2016-07-23 DIAGNOSIS — M25511 Pain in right shoulder: Secondary | ICD-10-CM | POA: Insufficient documentation

## 2016-07-23 DIAGNOSIS — H538 Other visual disturbances: Secondary | ICD-10-CM | POA: Insufficient documentation

## 2016-07-23 DIAGNOSIS — R42 Dizziness and giddiness: Secondary | ICD-10-CM

## 2016-07-23 LAB — CBC WITH DIFFERENTIAL/PLATELET
BASOS ABS: 0 {cells}/uL (ref 0–200)
Basophils Relative: 0 %
EOS PCT: 3 %
Eosinophils Absolute: 162 cells/uL (ref 15–500)
HCT: 39.7 % (ref 35.0–45.0)
Hemoglobin: 13.4 g/dL (ref 11.7–15.5)
Lymphocytes Relative: 41 %
Lymphs Abs: 2214 cells/uL (ref 850–3900)
MCH: 30.5 pg (ref 27.0–33.0)
MCHC: 33.8 g/dL (ref 32.0–36.0)
MCV: 90.4 fL (ref 80.0–100.0)
MONOS PCT: 7 %
MPV: 9.6 fL (ref 7.5–12.5)
Monocytes Absolute: 378 cells/uL (ref 200–950)
NEUTROS PCT: 49 %
Neutro Abs: 2646 cells/uL (ref 1500–7800)
PLATELETS: 307 10*3/uL (ref 140–400)
RBC: 4.39 MIL/uL (ref 3.80–5.10)
RDW: 13.8 % (ref 11.0–15.0)
WBC: 5.4 10*3/uL (ref 3.8–10.8)

## 2016-07-23 LAB — TSH: TSH: 2.18 m[IU]/L

## 2016-07-23 LAB — BASIC METABOLIC PANEL
BUN: 9 mg/dL (ref 7–25)
CALCIUM: 9.3 mg/dL (ref 8.6–10.2)
CO2: 25 mmol/L (ref 20–31)
Chloride: 104 mmol/L (ref 98–110)
Creat: 0.58 mg/dL (ref 0.50–1.10)
GLUCOSE: 90 mg/dL (ref 65–99)
Potassium: 4.3 mmol/L (ref 3.5–5.3)
SODIUM: 138 mmol/L (ref 135–146)

## 2016-07-23 LAB — GLUCOSE, POCT (MANUAL RESULT ENTRY): POC Glucose: 88 mg/dl (ref 70–99)

## 2016-07-23 LAB — POCT GLYCOSYLATED HEMOGLOBIN (HGB A1C): Hemoglobin A1C: 5.5

## 2016-07-23 MED ORDER — CYCLOBENZAPRINE HCL 10 MG PO TABS: 10.0000 mg | ORAL_TABLET | Freq: Three times a day (TID) | ORAL | 0 refills | Status: DC | PRN

## 2016-07-23 MED FILL — ?CYCLOBENZAPRINE 10 MG TABL: 10 | 10 days supply | Qty: 30 | Fill #0

## 2016-07-23 NOTE — Progress Notes (Signed)
Pt here for blurry vision and states her "head feels as if she is drunk". She states this has been happening since July 4th. Pt reports pain in her neck and shoulder rated at a 7.

## 2016-07-23 NOTE — Progress Notes (Signed)
Chief Complaint: Blurred vision and weakness  Subjective: This is a 40 year old female with a history of prediabetes and vitamin D deficiency has not been seen in greater than one year. She states that since July 4th she's been having issues with weakness and blurred vision. It's constant. She has not changed any medications. She can't quite pinpoint what is going on. She may be eating a little more. She denies polydipsia or polyuria. No headaches. No blackouts.   She also states she's been having some right arm or right shoulder tightness. Her neck is tight as well. She doesn't remember injuring the area but she has quite a difficult time trying to get comfortable sleeping.   ROS:  GEN: denies fever or chills, denies change in weight Skin: denies lesions or rashes HEENT: denies headache, earache, epistaxis, sore throat, or neck pain+blurreed vision LUNGS: denies SHOB, dyspnea, PND, orthopnea CV: denies CP or palpitations ABD: denies abd pain, N or V EXT: denies muscle spasms or swelling; no pain in lower ext, no weakness NEURO: denies numbness or tingling, denies sz, stroke or TIA   Objective:  Vitals:   07/23/16 1132  Weight: 206 lb 6.4 oz (93.6 kg)  Height: 5' 3.5" (1.613 m)    Physical Exam:  General: in no acute distress. HEENT: no pallor, no icterus, moist oral mucosa, no JVD, no lymphadenopathy Heart: Normal  s1 &s2  Regular rate and rhythm, without murmurs, rubs, gallops. Lungs: Clear to auscultation bilaterally. Abdomen: Soft, nontender, nondistended, positive bowel sounds. Extremities: No clubbing cyanosis or edema with positive pedal pulses.; sore to palpation of right neck and shoulder Neuro: Alert, awake, oriented x3, nonfocal.   Medications: Prior to Admission medications   Medication Sig Start Date End Date Taking? Authorizing Provider  cyclobenzaprine (FLEXERIL) 10 MG tablet Take 1 tablet (10 mg total) by mouth 3 (three) times daily as needed for muscle  spasms. 07/23/16   Vivianne Master, PA-C  doxycycline (VIBRAMYCIN) 100 MG capsule Take 1 capsule (100 mg total) by mouth 2 (two) times daily. Take one tablet twice a dy starting the day before procedure. Patient not taking: Reported on 04/29/2015 01/26/15   Ok Edwards, MD  gabapentin (NEURONTIN) 100 MG capsule Take 1 capsule (100 mg total) by mouth 3 (three) times daily. Patient not taking: Reported on 04/29/2015 09/28/14   Doris Cheadle, MD  metroNIDAZOLE (FLAGYL) 500 MG tablet Take 1 tablet (500 mg total) by mouth 2 (two) times daily. Patient not taking: Reported on 07/23/2016 04/29/15   Ambrose Finland, NP  terbinafine (LAMISIL AT) 1 % cream Apply 1 application topically 2 (two) times daily. Patient not taking: Reported on 07/23/2016 04/29/15   Ambrose Finland, NP  Vitamin D, Ergocalciferol, (DRISDOL) 50000 UNITS CAPS capsule Take 1 capsule (50,000 Units total) by mouth every 7 (seven) days. Patient not taking: Reported on 01/26/2015 03/23/14   Doris Cheadle, MD    Assessment: 1. Blurred vision/Weakness ?etiology 2. Right sided pain (shoulder/neck) sounds MSK   Plan: CBC, A1C checked and normal CBC, BMP, TSH, Vit D Flexeril  Follow up: 2 weeks  The patient was given clear instructions to go to ER or return to medical center if symptoms don't improve, worsen or new problems develop. The patient verbalized understanding. The patient was told to call to get lab results if they haven't heard anything in the next week.   This note has been created with Education officer, environmental. Any transcriptional errors are unintentional.  Scot Jun, PA-C 07/23/2016, 12:16 PM

## 2016-07-24 LAB — VITAMIN D 25 HYDROXY (VIT D DEFICIENCY, FRACTURES): VIT D 25 HYDROXY: 23 ng/mL — AB (ref 30–100)

## 2016-07-30 ENCOUNTER — Other Ambulatory Visit: Payer: Self-pay | Admitting: Physician Assistant

## 2016-07-30 MED ORDER — VITAMIN D (ERGOCALCIFEROL) 1.25 MG (50000 UNIT) PO CAPS: 50000.0000 [IU] | ORAL_CAPSULE | ORAL | 0 refills | Status: DC

## 2016-08-08 ENCOUNTER — Other Ambulatory Visit: Payer: Self-pay | Admitting: Physician Assistant

## 2016-08-08 ENCOUNTER — Encounter: Payer: Self-pay | Admitting: Family Medicine

## 2016-08-08 ENCOUNTER — Ambulatory Visit: Payer: Self-pay | Attending: Family Medicine | Admitting: Family Medicine

## 2016-08-08 VITALS — BP 107/72 | HR 77 | Temp 98.1°F | Ht 63.5 in | Wt 208.6 lb

## 2016-08-08 DIAGNOSIS — R200 Anesthesia of skin: Secondary | ICD-10-CM | POA: Insufficient documentation

## 2016-08-08 DIAGNOSIS — Z8249 Family history of ischemic heart disease and other diseases of the circulatory system: Secondary | ICD-10-CM | POA: Insufficient documentation

## 2016-08-08 DIAGNOSIS — Z833 Family history of diabetes mellitus: Secondary | ICD-10-CM | POA: Insufficient documentation

## 2016-08-08 DIAGNOSIS — E559 Vitamin D deficiency, unspecified: Secondary | ICD-10-CM

## 2016-08-08 DIAGNOSIS — R202 Paresthesia of skin: Secondary | ICD-10-CM

## 2016-08-08 DIAGNOSIS — Z79899 Other long term (current) drug therapy: Secondary | ICD-10-CM | POA: Insufficient documentation

## 2016-08-08 MED FILL — VIT D2 1.25 MG (50,000 UNIT: 1.25 MG | 84 days supply | Qty: 12 | Fill #0

## 2016-08-08 NOTE — Progress Notes (Signed)
CC: Follow up visit  HPI: Sierra Novak is a 40 y.o. female here today for a follow up visit. At her last visit she was seen by the PA and placed on cyclobenzaprine and gabapentin for numbness and tingling in the right arm and right-sided neck pain but she has stopped taking the cyclobenzaprine because it makes her have a "drunk" feeling and she never picked up the gabapentin. Reports that she took gabapentin in the past and did have some edema.  Her labs revealed an A1c of 5.5, normal TSH but low vitamin D for which Drisdol was sent to her pharmacy and she never picked this up  Patient has No headache, No chest pain, No abdominal pain - No Nausea, No new weakness tingling or numbness, No Cough - SOB.  No Known Allergies History reviewed. No pertinent past medical history. Current Outpatient Prescriptions on File Prior to Visit  Medication Sig Dispense Refill  . cyclobenzaprine (FLEXERIL) 10 MG tablet Take 1 tablet (10 mg total) by mouth 3 (three) times daily as needed for muscle spasms. (Patient not taking: Reported on 08/08/2016) 30 tablet 0  . gabapentin (NEURONTIN) 100 MG capsule Take 1 capsule (100 mg total) by mouth 3 (three) times daily. (Patient not taking: Reported on 04/29/2015) 90 capsule 3  . Vitamin D, Ergocalciferol, (DRISDOL) 50000 units CAPS capsule Take 1 capsule (50,000 Units total) by mouth every 7 (seven) days. (Patient not taking: Reported on 08/08/2016) 12 capsule 0   No current facility-administered medications on file prior to visit.    Family History  Problem Relation Age of Onset  . Hypertension Father   . Diabetes Paternal Aunt    Social History   Social History  . Marital status: Legally Separated    Spouse name: N/A  . Number of children: N/A  . Years of education: N/A   Occupational History  . Not on file.   Social History Main Topics  . Smoking status: Never Smoker  . Smokeless tobacco: Never Used  . Alcohol use No  . Drug use: No  . Sexual  activity: Yes     Comment: 1ST INTERCOURSE- 8323, PARTNERS- 1   Other Topics Concern  . Not on file   Social History Narrative   ** Merged History Encounter **        Review of Systems: Constitutional: Negative for fever, chills, diaphoresis, activity change, appetite change and fatigue. HENT: Negative for ear pain, nosebleeds, congestion, facial swelling, rhinorrhea, neck pain, neck stiffness and ear discharge.  Eyes: Negative for pain, discharge, redness, itching and visual disturbance. Respiratory: Negative for cough, choking, chest tightness, shortness of breath, wheezing and stridor.  Cardiovascular: Negative for chest pain, palpitations and leg swelling. Gastrointestinal: Negative for abdominal distention. Genitourinary: Negative for dysuria, urgency, frequency, hematuria, flank pain, decreased urine volume, difficulty urinating and dyspareunia.  Musculoskeletal: Negative for back pain, joint swelling, arthralgias and gait problem, positive for right sided neck pain. Neurological: Negative for dizziness, tremors, seizures, syncope, facial asymmetry, speech difficulty, weakness, light-headedness,positive for numbness Hematological: Negative for adenopathy. Does not bruise/bleed easily. Psychiatric/Behavioral: Negative for hallucinations, behavioral problems, confusion, dysphoric mood, decreased concentration and agitation.    Objective:   Vitals:   08/08/16 1458  BP: 107/72  Pulse: 77  Temp: 98.1 F (36.7 C)    Physical Exam: Constitutional: Patient appears well-developed and well-nourished. No distress. HENT: Normocephalic, atraumatic, External right and left ear normal. Oropharynx is clear and moist.  Eyes: Conjunctivae and EOM are normal. PERRLA, no scleral  icterus. Neck: Normal ROM. Neck supple. No JVD. No tracheal deviation. No thyromegaly. CVS: RRR, S1/S2 +, no murmurs, no gallops, no carotid bruit.  Pulmonary: Effort and breath sounds normal, no stridor, rhonchi,  wheezes, rales.  Abdominal: Soft. BS +,  no distension, tenderness, rebound or guarding.  Musculoskeletal: Normal range of motion. No edema and no tenderness.  Lymphadenopathy: No lymphadenopathy noted, cervical, inguinal or axillary Neuro: Alert. Normal reflexes, muscle tone coordination. No cranial nerve deficit. Skin: Skin is warm and dry. No rash noted. Not diaphoretic. No erythema. No pallor. Psychiatric: Normal mood and affect. Behavior, judgment, thought content normal.  Lab Results  Component Value Date   WBC 5.4 07/23/2016   HGB 13.4 07/23/2016   HCT 39.7 07/23/2016   MCV 90.4 07/23/2016   PLT 307 07/23/2016   Lab Results  Component Value Date   CREATININE 0.58 07/23/2016   BUN 9 07/23/2016   NA 138 07/23/2016   K 4.3 07/23/2016   CL 104 07/23/2016   CO2 25 07/23/2016    Lab Results  Component Value Date   HGBA1C 5.5 07/23/2016   Lipid Panel     Component Value Date/Time   CHOL 174 03/22/2014 1121   TRIG 117 03/22/2014 1121   HDL 43 03/22/2014 1121   CHOLHDL 4.0 03/22/2014 1121   VLDL 23 03/22/2014 1121   LDLCALC 108 (H) 03/22/2014 1121       Assessment and plan:   Vitamin D deficiency Advised to pickup Drisdol prescription at the pharmacy.  Numbness and tingling Advised to take Flexeril at night if this is sedating for her. If she is unable to tolerate gabapentin she can use OTC NSAIDs.  This note has been created with Education officer, environmental. Any transcriptional errors are unintentional.     Jaclyn Shaggy, MD. [facility] [phone] 08/08/2016, 3:43 PM

## 2016-09-17 ENCOUNTER — Encounter: Payer: Self-pay | Admitting: Family Medicine

## 2016-09-17 ENCOUNTER — Ambulatory Visit: Payer: Self-pay | Attending: Family Medicine | Admitting: Family Medicine

## 2016-09-17 VITALS — BP 113/76 | HR 61 | Temp 98.0°F | Wt 208.8 lb

## 2016-09-17 DIAGNOSIS — Z23 Encounter for immunization: Secondary | ICD-10-CM

## 2016-09-17 DIAGNOSIS — H538 Other visual disturbances: Secondary | ICD-10-CM | POA: Insufficient documentation

## 2016-09-17 DIAGNOSIS — E559 Vitamin D deficiency, unspecified: Secondary | ICD-10-CM | POA: Insufficient documentation

## 2016-09-17 DIAGNOSIS — Z124 Encounter for screening for malignant neoplasm of cervix: Secondary | ICD-10-CM

## 2016-09-17 DIAGNOSIS — Z79899 Other long term (current) drug therapy: Secondary | ICD-10-CM | POA: Insufficient documentation

## 2016-09-17 NOTE — Patient Instructions (Signed)
Vacuna antigripal (inactivada o recombinante):  (Influenza [Flu] Vaccine [Inactivated or Recombinant]: )  1. Por qu vacunarse? La influenza ("gripe") es una enfermedad contagiosa que se propaga en todo el territorio de los [place] cada ao, por lo general entre octubre y mayo. La gripe es causada por los virus de la influenza y se propaga principalmente por la tos, el estornudo y el contacto directo. Cualquier persona puede tener gripe. Esta enfermedad comienza repentinamente y puede durar varios das. Los sntomas varan segn la edad, pero pueden incluir:  Fiebre o escalofros.  Dolor de garganta.  Dolores musculares.  Fatiga.  Tos.  Dolor de cabeza.  Secrecin o congestin nasal. La gripe, adems, puede derivar en neumona e infecciones de la sangre, y causar diarrea y convulsiones en los nios. Si tiene una afeccin, por ejemplo, enfermedad cardaca o pulmonar, la gripe puede empeorarla. En algunas personas, la gripe es ms peligrosa. Los bebs y los nios pequeos, los mayores de 65aos, las embarazadas, as como las personas que tienen ciertas enfermedades o cuyo sistema inmunitario est debilitado corren un riesgo mayor. Cada ao, miles de personas mueren en los [place] debido a la gripe, y muchas ms deben ser hospitalizadas. La vacuna antigripal puede:  Protegerlo contra la gripe.  Hacer que la gripe sea menos grave en caso de que la contraiga.  Evitar que se la transmita a su familia y a otras personas. 2. Vacunas antigripales inactivadas y recombinantes Cada temporada de gripe, se recomienda la aplicacin de una dosis de vacuna antigripal. Es posible que los nios menores de 6meses a 8aos deban recibir dos dosis durante la misma temporada de gripe. Todas las dems personas tienen que aplicarse una sola dosis cada temporada de gripe. Algunas de las vacunas antigripales inactivadas contienen una cantidad muy pequea de un conservante a base de mercurio  llamado timerosal. Algunos estudios han demostrado que el timerosal en las vacunas no es perjudicial, pero se dispone de vacunas antigripales que no contienen el conservante. Las vacunas antigripales no se elaboran con el virus vivo de la gripe. No pueden causar gripe. Hay muchos virus de la gripe, y estos mutan permanentemente. Cada ao, se elabora una nueva vacuna antigripal para brindar proteccin contra tres o cuatro virus que probablemente causen la enfermedad en la siguiente temporada de gripe. Pero incluso si la vacuna no es especfica para estos virus, aun as puede brindar cierta proteccin. La vacuna antigripal no puede evitar:  La gripe causada por un virus que la vacuna no puede prevenir.  Las enfermedades que se parecen a la gripe, pero que no lo son. La vacuna comienza a surtir efecto aproximadamente 2semanas despus de su aplicacin y brinda proteccin durante la temporada de gripe. 3. Algunas personas no deben recibir esta vacuna Informe a la persona que le aplica la vacuna:  Si tiene alergias graves, potencialmente mortales. Si alguna vez tuvo una reaccin alrgica potencialmente mortal despus de recibir una dosis de la vacuna antigripal, o tuvo una alergia grave a cualquiera de los componentes de esta vacuna, es posible que se le recomiende no vacunarse. La mayora de los tipos de vacuna antigripal, si bien no todos, contienen una pequea cantidad de protena de huevo.  Si alguna vez tuvo sndrome de Guillain-Barre (tambin llamado GBS). Algunas personas con antecedentes de GBS no deben recibir esta vacuna. Debe hablar al respecto con el mdico.  Si no se siente bien. Por lo general, puede recibir la vacuna antigripal si tiene una enfermedad leve; sin embargo, podran   pedirle que regrese cuando se sienta mejor. 4. Riesgos de una reaccin a la vacuna Con cualquier medicamento, incluidas las vacunas, existe la posibilidad de que haya reacciones. Estas suelen ser leves y  desaparecer por s solas, pero tambin es posible que se presenten reacciones graves. La mayora de las personas a las que se les aplica la vacuna antigripal no tienen ningn problema. Los problemas menores despus de la aplicacin de la vacuna antigripal incluyen:  Dolor, enrojecimiento o hinchazn en el lugar en el que le aplicaron la vacuna.  Ronquera.  Dolor, enrojecimiento o picazn en los ojos.  Tos.  Fiebre.  Dolores.  Dolor de cabeza.  Picazn.  Fatiga. Si estos problemas ocurren, en general comienzan poco despus de la aplicacin de la vacuna y duran 1 o 2das. Los problemas ms graves despus de la aplicacin de la vacuna antigripal pueden incluir lo siguiente:  Puede haber un pequeo aumento del riesgo de sufrir sndrome de Guillain-Barre (GBS) despus de la aplicacin de la vacuna antigripal inactivada. El clculo estimativo de este riesgo es de 1 o 2casos por cada milln de personas vacunadas, mucho ms bajo que el riesgo de sufrir complicaciones graves debido a la gripe, que pueden evitarse con la vacuna antigripal.  Los nios pequeos que reciben la vacuna antigripal junto con la vacuna antineumoccica (PCV13) o la DTaP en el mismo momento pueden tener una probabilidad un poco ms elevada de tener una convulsin debido a la fiebre. Consulte al mdico para obtener ms informacin. Informe al mdico si un nio que est recibiendo la vacuna antigripal ha tenido una convulsin alguna vez. Problemas que podran ocurrir despus de cualquier vacuna inyectable:  Las personas a veces se desmayan despus de un procedimiento mdico, incluida la vacunacin. Permanecer sentado o recostado durante 15minutos puede ayudar a evitar los desmayos y las lesiones causadas por las cadas. Informe al mdico si se siente mareado, tiene cambios en la visin o zumbidos en los odos.  Algunas personas sienten un dolor intenso en el hombro y tienen dificultad para mover el brazo donde se coloc  la vacuna. Esto sucede con muy poca frecuencia.  Cualquier medicamento puede causar una reaccin alrgica grave. Dichas reacciones son muy poco frecuentes con una vacuna (se calcula que menos de 1en un milln de dosis) y se producen unos minutos a unas horas despus de la vacunacin. Al igual que con cualquier medicamento, existe una probabilidad muy remota de que una vacuna cause una lesin grave o la muerte. Se controla permanentemente la seguridad de las vacunas. Para obtener ms informacin, visite: www.cdc.gov/vaccinesafety/. 5. Qu pasa si hay una reaccin grave? A qu signos debo estar atento?  Observe todo lo que le preocupe, como signos de una reaccin alrgica grave, fiebre muy alta o comportamiento fuera de lo normal. Los signos de una reaccin alrgica grave pueden incluir ronchas, hinchazn de la cara y la garganta, dificultad para respirar, latidos cardacos acelerados, mareos y debilidad. Pueden comenzar entre unos pocos minutos y algunas horas despus de la vacunacin. Qu debo hacer?  Si cree que se trata de una reaccin alrgica grave o de otra emergencia que no puede esperar, llame al911 o lleve a la persona al hospital ms cercano. De lo contrario, llame al mdico.  Las reacciones deben informarse al Sistema de Informacin sobre Efectos Adversos de las Vacunas (Vaccine Adverse Event Reporting System, VAERS). El mdico debe presentar este informe, o bien puede hacerlo usted mismo a travs del sitio web de VAERS, en www.vaers.hhs.gov, o llamando   al 1-800-822-7967. VAERSno ofrece consejos mdicos. 6. Programa Nacional de Compensacin de Daos por Vacunas El Programa Nacional de Compensacin de Daos por Vacunas (National Vaccine Injury Compensation Program, VICP) es un programa federal que fue creado para compensar a las personas que puedan haber sufrido daos al recibir ciertas vacunas. Aquellas personas que consideren que han sufrido un dao como consecuencia de una vacuna y  quieran saber ms acerca del programa y de cmo presentar un reclamo, pueden llamar al 1-800-338-2382 o visitar el sitio web del VICP en www.hrsa.gov/vaccinecompensation. Hay un lmite de tiempo para presentar un reclamo de compensacin. 7. Cmo puedo obtener ms informacin?  Pregntele al mdico. Este puede darle el prospecto de la vacuna o recomendarle otras fuentes de informacin.  Comunquese con el servicio de salud de su localidad o su estado.  Comunquese con los Centros para el Control y la Prevencin de Enfermedades (Centers for Disease Control and Prevention, CDC):  Llame al 1-800-232-4636 (1-800-CDC-INFO) o  visite el sitio web de los CDC en www.cdc.gov/flu. Declaracin de informacin sobre la vacuna Vacuna antigripal inactivada (08/06/2014)   Esta informacin no tiene como fin reemplazar el consejo del mdico. Asegrese de hacerle al mdico cualquier pregunta que tenga.   Document Released: 03/15/2009 Document Revised: 01/07/2015 Elsevier Interactive Patient Education 2016 Elsevier Inc.  

## 2016-09-17 NOTE — Progress Notes (Signed)
Pt states she is here today for the following:  Follow up  - Vitamin D deficiency PAP Blurry vision x yesterday am

## 2016-09-17 NOTE — Progress Notes (Signed)
Subjective:  Patient ID: Sierra Novak, female    DOB: 1976-12-10  Age: 40 y.o. MRN: [id number]  CC: Follow-up (Vitamin D deficiency); Gynecologic Exam (PAP); and Blurred Vision (itchy, blurry vision x yesterday am )   HPI Sierra Novak presents for A Pap smear. She complains of intermittent blurry vision for the last few weeks the last of which was yesterday. She has no history of diabetes mellitus. Taking vitamin D capsules for vitamin D deficiency. No past medical history on file.   Past Surgical History:  Procedure Laterality Date  . CESAREAN SECTION    . CHOLECYSTECTOMY        Outpatient Medications Prior to Visit  Medication Sig Dispense Refill  . Vitamin D, Ergocalciferol, (DRISDOL) 50000 units CAPS capsule Take 1 capsule (50,000 Units total) by mouth every 7 (seven) days. 12 capsule 0  . cyclobenzaprine (FLEXERIL) 10 MG tablet TAKE 1 TABLET BY MOUTH 3 TIMES DAILY AS NEEDED FOR MUSCLE SPASMS. (Patient not taking: Reported on 09/17/2016) 30 tablet 0  . gabapentin (NEURONTIN) 100 MG capsule Take 1 capsule (100 mg total) by mouth 3 (three) times daily. (Patient not taking: Reported on 09/17/2016) 90 capsule 3   No facility-administered medications prior to visit.     ROS Review of Systems  Constitutional: Negative for activity change, appetite change and fatigue.  HENT: Negative for congestion, sinus pressure and sore throat.   Eyes: Positive for visual disturbance.  Respiratory: Negative for cough, chest tightness, shortness of breath and wheezing.   Cardiovascular: Negative for chest pain and palpitations.  Gastrointestinal: Negative for abdominal distention, abdominal pain and constipation.  Endocrine: Negative for polydipsia.  Genitourinary: Negative for dysuria and frequency.  Musculoskeletal: Negative for arthralgias and back pain.  Skin: Negative for rash.  Neurological: Negative for tremors, light-headedness and numbness.  Hematological: Does  not bruise/bleed easily.  Psychiatric/Behavioral: Negative for agitation and behavioral problems.    Objective:  BP 113/76 (BP Location: Right Arm, Patient Position: Sitting, Cuff Size: Large)   Pulse 61   Temp 98 F (36.7 C) (Oral)   Wt 208 lb 12.8 oz (94.7 kg)   LMP 09/13/2016 (Exact Date)   BMI 36.41 kg/m   BP/Weight 09/17/2016 08/08/2016 07/23/2016  Systolic BP 113 107 122  Diastolic BP 76 72 85  Wt. (Lbs) 208.8 208.6 206.4  BMI 36.41 36.37 35.99      Physical Exam  Constitutional: She is oriented to person, place, and time. She appears well-developed and well-nourished.  Cardiovascular: Normal rate, normal heart sounds and intact distal pulses.   No murmur heard. Pulmonary/Chest: Effort normal and breath sounds normal. She has no wheezes. She has no rales. She exhibits no tenderness.  Abdominal: Soft. Bowel sounds are normal. She exhibits no distension and no mass. There is no tenderness.  Genitourinary:  Genitourinary Comments: Normal external genitalia, normal vagina, normal cervix, bloody discharge from the cervical os, normal adnexa  Musculoskeletal: Normal range of motion.  Neurological: She is alert and oriented to person, place, and time.     Assessment & Plan:   1. Blurry vision, bilateral Discussed available community resources for eye exams A1c was 5.5 last month -negative for diabetes  2. Screening for cervical cancer - Cytology - PAP - Cervicovaginal ancillary only  3. Encounter for immunization - Flu Vaccine QUAD 36+ mos IM   No orders of the defined types were placed in this encounter.   Follow-up: Return in about 6 months (around 03/17/2017) for Follow-up on vitamin D  deficiency.   Jaclyn ShaggyEnobong Amao MD

## 2016-09-18 LAB — CERVICOVAGINAL ANCILLARY ONLY: WET PREP (BD AFFIRM): NEGATIVE

## 2016-09-18 LAB — CYTOLOGY - PAP

## 2016-09-20 ENCOUNTER — Telehealth: Payer: Self-pay

## 2016-09-20 NOTE — Telephone Encounter (Signed)
Clld pt   Used [employer] Ena Dawleyania ID# [id number]  [facility] lab results

## 2016-09-20 NOTE — Telephone Encounter (Signed)
-----   Message from Jaclyn ShaggyEnobong Amao, MD sent at 09/19/2016  1:21 PM EDT ----- PAP smear and wet prep are normal

## 2016-10-03 ENCOUNTER — Telehealth: Payer: Self-pay | Admitting: Family Medicine

## 2016-10-03 NOTE — Telephone Encounter (Signed)
Patient states she was informed to continue taking Vitamin D.   Patient is requesting medication refill.   Please follow up

## 2016-10-03 NOTE — Telephone Encounter (Signed)
Will forward request to Dr. Venetia NightAmao so she can determine what dose of vitamin D she would like the patient to continue on.

## 2016-10-04 MED ORDER — VITAMIN D (ERGOCALCIFEROL) 1.25 MG (50000 UNIT) PO CAPS: 50000.0000 [IU] | ORAL_CAPSULE | ORAL | 0 refills | Status: DC

## 2016-10-04 NOTE — Telephone Encounter (Signed)
Prescription sent to pharmacy.

## 2016-10-05 NOTE — Telephone Encounter (Signed)
Through [employer] writer called patient and LVM's with her phone and her sister's phone that a prescription for the vitamin D was sent to her pharmacy by Dr. Venetia NightAmao.

## 2016-10-11 ENCOUNTER — Telehealth: Payer: Self-pay | Admitting: Family Medicine

## 2016-10-11 NOTE — Telephone Encounter (Signed)
Patient came to the office to pick refill of Vitamin D. Patient was not aware that she had already used up all her refills. Pt misunderstood the instructions on the bottle and therefore was taking the pill everyday rather than once a week. Pt stated that she hasn't had any side effects. Dee advised that she either have lab work done or see PCP. Please follow up.   Thank you

## 2016-10-12 NOTE — Telephone Encounter (Signed)
She was supposed to take 1 by mouth every week for 2 months after which she would have a vitamin D level checked. At this time she does not need refill; repeat vitamin D level will not be done now but at her next office visit down the road to reassess if she is still deficient.

## 2016-10-16 NOTE — Telephone Encounter (Signed)
[facility] patient was called and stated understanding about the vit D issue and knows that she will be retested for her levels at her next visit.

## 2016-10-25 NOTE — Progress Notes (Signed)
Result letter sent.

## 2017-03-23 ENCOUNTER — Encounter (HOSPITAL_COMMUNITY): Payer: Self-pay | Admitting: *Deleted

## 2017-03-23 ENCOUNTER — Emergency Department (HOSPITAL_COMMUNITY)
Admission: EM | Admit: 2017-03-23 | Discharge: 2017-03-23 | Disposition: A | Discharge status: Home / Self Care | Payer: Self-pay | Attending: Emergency Medicine | Admitting: Emergency Medicine

## 2017-03-23 DIAGNOSIS — T7840XA Allergy, unspecified, initial encounter: Secondary | ICD-10-CM

## 2017-03-23 DIAGNOSIS — L509 Urticaria, unspecified: Secondary | ICD-10-CM

## 2017-03-23 DIAGNOSIS — L500 Allergic urticaria: Secondary | ICD-10-CM | POA: Insufficient documentation

## 2017-03-23 MED ORDER — PREDNISONE 10 MG PO TABS: 60.0000 mg | ORAL_TABLET | Freq: Every day | ORAL | 0 refills | Status: DC

## 2017-03-23 MED ORDER — FAMOTIDINE 20 MG PO TABS
20.0000 mg | ORAL_TABLET | Freq: Once | ORAL | Status: AC
  Administered 2017-03-23: 20 mg via ORAL

## 2017-03-23 MED ORDER — DIPHENHYDRAMINE HCL 25 MG PO TABS: 25.0000 mg | ORAL_TABLET | Freq: Four times a day (QID) | ORAL | 0 refills | Status: DC

## 2017-03-23 MED ORDER — PREDNISONE 20 MG PO TABS
60.0000 mg | ORAL_TABLET | Freq: Once | ORAL | Status: AC
  Administered 2017-03-23: 60 mg via ORAL

## 2017-03-23 MED ORDER — DIPHENHYDRAMINE HCL 25 MG PO CAPS
50.0000 mg | ORAL_CAPSULE | Freq: Once | ORAL | Status: AC
  Administered 2017-03-23: 50 mg via ORAL

## 2017-03-23 MED ORDER — FAMOTIDINE 20 MG PO TABS
20.0000 mg | ORAL_TABLET | Freq: Two times a day (BID) | ORAL | 0 refills | Status: DC
Start: 2017-03-23 — End: 2019-02-01

## 2017-03-23 NOTE — ED Triage Notes (Signed)
The pts hives have receded her face and head have cleared and her itching has stopped

## 2017-03-23 NOTE — ED Notes (Signed)
Pt discussed with dr Patria Manecampos

## 2017-03-23 NOTE — ED Triage Notes (Signed)
The pt is c/o a rash and itching since this am the rash looks like older hives  Not as puffy and  Irritated.  Both hands sl swollen and red and she has more itching under her clothes rt leg  She thinks she had eggs and maybe juice  No previous history of a reaction to anything  lmp now  No respiratory difficulty

## 2017-03-23 NOTE — ED Notes (Signed)
Pt discussed with dr Patria Manecampos he has ordered meds   given

## 2017-03-23 NOTE — ED Notes (Signed)
Pt departed in NAD, refused use of wheelchair.  

## 2017-03-23 NOTE — ED Provider Notes (Signed)
[facility]EM[facility]GENCY DEPT Provider Note   CSN: [id number] Arrival date & time: 03/23/17  0151     History   Chief Complaint Chief Complaint  Patient presents with  . Allergic Reaction    HPI Sierra Novak is a 41 y.o. female.  Patient presents after breakout of hives about 10 hours ago that has improved since onset. No respiratory issues or facial swelling noted. Patient states there were itchy hives on both of her arms and upper thighs. She believes it was a blueberry juice that may have caused the reaction. Symptoms have improved since Benadryl, Pepcid and prednisone were given. Patient has previous history of similar episode but was unsure of what caused it that time. Denies history of seasonal allergies, chest pain, trouble breathing, abdominal pain, bowel changes. History was limited due to patient difficulty speaking [place].       History reviewed. No pertinent past medical history.  Patient Active Problem List   Diagnosis Date Noted  . Numbness and tingling 09/28/2014  . Vitamin D deficiency 09/28/2014  . Headache(784.0) 03/22/2014    Past Surgical History:  Procedure Laterality Date  . CESAREAN SECTION    . CHOLECYSTECTOMY      OB History    Gravida Para Term Preterm AB Living   5 1     4 2    SAB TAB Ectopic Multiple Live Births         1         Home Medications    Prior to Admission medications   Medication Sig Start Date End Date Taking? Authorizing Provider  cyclobenzaprine (FLEXERIL) 10 MG tablet TAKE 1 TABLET BY MOUTH 3 TIMES DAILY AS NEEDED FOR MUSCLE SPASMS. Patient not taking: Reported on 09/17/2016 08/08/16   Jaclyn Shaggy, MD  diphenhydrAMINE (BENADRYL) 25 MG tablet Take 1 tablet (25 mg total) by mouth every 6 (six) hours. 03/23/17   Azalia Bilis, MD  famotidine (PEPCID) 20 MG tablet Take 1 tablet (20 mg total) by mouth 2 (two) times daily. 03/23/17   Azalia Bilis, MD  gabapentin (NEURONTIN) 100 MG capsule Take 1 capsule (100 mg total) by  mouth 3 (three) times daily. Patient not taking: Reported on 09/17/2016 09/28/14   Doris Cheadle, MD  predniSONE (DELTASONE) 10 MG tablet Take 6 tablets (60 mg total) by mouth daily. 03/23/17   Azalia Bilis, MD  Vitamin D, Ergocalciferol, (DRISDOL) 50000 units CAPS capsule Take 1 capsule (50,000 Units total) by mouth every 7 (seven) days. 10/04/16   Jaclyn Shaggy, MD    Family History Family History  Problem Relation Age of Onset  . Hypertension Father   . Diabetes Paternal Aunt     Social History Social History  Substance Use Topics  . Smoking status: Never Smoker  . Smokeless tobacco: Never Used  . Alcohol use No     Allergies   Patient has no known allergies.   Review of Systems Review of Systems  Constitutional: Negative for chills and fever.  HENT: Negative for facial swelling, rhinorrhea and sore throat.   Eyes: Negative for visual disturbance.  Respiratory: Negative for cough, chest tightness and shortness of breath.   Cardiovascular: Negative for chest pain.  Gastrointestinal: Negative for abdominal pain.  Genitourinary: Negative for dysuria.  Musculoskeletal: Negative for back pain.  Skin: Positive for rash.  Neurological: Negative for light-headedness and headaches.  Hematological: Negative for adenopathy.     Physical Exam Updated Vital Signs BP 115/76   Pulse 78   Temp 97.7 F (36.5  C) (Oral)   Resp 17   LMP 03/23/2017   SpO2 97%   Physical Exam  Constitutional: She appears well-developed and well-nourished. No distress.  HENT:  Head: Normocephalic and atraumatic.  Nose: Nose normal.  Eyes: Conjunctivae and EOM are normal. Left eye exhibits no discharge. No scleral icterus.  Neck: Normal range of motion and phonation normal. Neck supple. No tracheal deviation present.  Cardiovascular: Normal rate, regular rhythm, normal heart sounds and intact distal pulses.   Pulmonary/Chest: Effort normal and breath sounds normal. No stridor. No respiratory  distress.  Abdominal: Soft. Bowel sounds are normal. She exhibits no distension. There is no tenderness.  Musculoskeletal: Normal range of motion. She exhibits no edema.  Neurological: She is alert. She exhibits normal muscle tone. Coordination normal.  Skin: Skin is warm and dry. Rash noted.  Erythematous areas of hives on bilateral UE and upper thighs that appear have improved. Patient states these are less itchy now.  No facial swelling.  Psychiatric: She has a normal mood and affect.  Nursing note and vitals reviewed.    ED Treatments / Results  Labs (all labs ordered are listed, but only abnormal results are displayed) Labs Reviewed - No data to display  EKG  EKG Interpretation None       Radiology No results found.  Procedures Procedures (including critical care time)  Medications Ordered in ED Medications  diphenhydrAMINE (BENADRYL) capsule 50 mg (50 mg Oral Given 03/23/17 0229)  famotidine (PEPCID) tablet 20 mg (20 mg Oral Given 03/23/17 0230)  predniSONE (DELTASONE) tablet 60 mg (60 mg Oral Given 03/23/17 0229)     Initial Impression / Assessment and Plan / ED Course  I have reviewed the triage vital signs and the nursing notes.  Pertinent labs & imaging results that were available during my care of the patient were reviewed by me and considered in my medical decision making (see chart for details).     Patient's symptoms and history are concerning for allergic reaction caused by something she ate earlier today. No signs or symptoms of anaphylaxis.  Her symptoms have improved in the ED with Pepcid, prednisone and benadryl. She was educated on not drinking the juice that she believed caused the reaction. Patient was advised to follow up for allergy testing. Given Pepcid, Benadryl and prednisone burst at d/c. Return precautions were given.  Final Clinical Impressions(s) / ED Diagnoses   Final diagnoses:  Allergic reaction, initial encounter  Urticaria     New Prescriptions Discharge Medication List as of 03/23/2017  5:45 AM    START taking these medications   Details  diphenhydrAMINE (BENADRYL) 25 MG tablet Take 1 tablet (25 mg total) by mouth every 6 (six) hours., Starting Sat 03/23/2017, Print    famotidine (PEPCID) 20 MG tablet Take 1 tablet (20 mg total) by mouth 2 (two) times daily., Starting Sat 03/23/2017, Print    predniSONE (DELTASONE) 10 MG tablet Take 6 tablets (60 mg total) by mouth daily., Starting Sat 03/23/2017, Print         StauntonHina Jamaurion Slemmer, [place] 03/23/17 [id number]    Azalia BilisKevin Campos, MD 03/23/17 [phone]

## 2017-03-24 ENCOUNTER — Emergency Department (HOSPITAL_COMMUNITY)
Admission: EM | Admit: 2017-03-24 | Discharge: 2017-03-24 | Disposition: A | Discharge status: Home / Self Care | Payer: Self-pay | Attending: Emergency Medicine | Admitting: Emergency Medicine

## 2017-03-24 ENCOUNTER — Encounter (HOSPITAL_COMMUNITY): Payer: Self-pay | Admitting: Emergency Medicine

## 2017-03-24 DIAGNOSIS — L509 Urticaria, unspecified: Secondary | ICD-10-CM | POA: Insufficient documentation

## 2017-03-24 MED ORDER — DIPHENHYDRAMINE HCL 50 MG/ML IJ SOLN
50.0000 mg | Freq: Once | INTRAMUSCULAR | Status: AC
  Administered 2017-03-24: 50 mg via INTRAMUSCULAR
  Filled 2017-03-24: qty 1

## 2017-03-24 MED ORDER — DEXAMETHASONE SODIUM PHOSPHATE 10 MG/ML IJ SOLN
10.0000 mg | Freq: Once | INTRAMUSCULAR | Status: AC
  Administered 2017-03-24: 10 mg via INTRAMUSCULAR
  Filled 2017-03-24: qty 1

## 2017-03-24 NOTE — ED Triage Notes (Signed)
Same rash all over that she was seen here for on Friday , still itching  , hive like rash

## 2017-03-24 NOTE — ED Notes (Signed)
EDP using interpretor iPad for d/c instructions at this time.

## 2017-03-24 NOTE — Discharge Instructions (Signed)
Continue to take benadryl, pepcid, prednisone. Follow up with family doctor or allergist if not improving. Return if any swelling to the lips, tongue, throat, any sores or lesions to the lips or mouth, any difficulty breathing, any fever, or any new concerning symptoms.

## 2017-03-24 NOTE — ED Provider Notes (Signed)
[facility]EMERGENCY DEPT Provider Note   CSN: [id number] Arrival date & time: 03/24/17  1006     History   Chief Complaint Chief Complaint  Patient presents with  . Rash    HPI Sierra Novak is a 41 y.o. female.  HPI Sierra Novak is a 41 y.o. female presents to emergency department complaining of rash. Patient states she has red itchy rash over bilateral arms, chest, thighs, abdomen. Patient states rash started 2 days ago. She was seen in emergency department when it started and was given oral Benadryl, Pepcid, prednisone. Sounds like she felt better and she was discharged home with 5 days of prednisone, Benadryl and Pepcid. Patient states she only filled her medications yesterday. She states this morning she took 50 mg of Benadryl, prednisone, Pepcid, however her rash has gotten worse. She reports rash is very itchy. She denies any rash of oral mucosa or lips. She denies any difficulty breathing. No swelling to the lips, tongue, oropharynx. She believes that the rash was a reaction to a juice that she had just prior to rash onset. She states she uses Lubriderm body wash and lotion and states she has been using it for a while with no prior reactions. She denies any other new products. No new detergent. Denies any history of similar rash in the past.  History reviewed. No pertinent past medical history.  Patient Active Problem List   Diagnosis Date Noted  . Numbness and tingling 09/28/2014  . Vitamin D deficiency 09/28/2014  . Headache(784.0) 03/22/2014    Past Surgical History:  Procedure Laterality Date  . CESAREAN SECTION    . CHOLECYSTECTOMY      OB History    Gravida Para Term Preterm AB Living   5 1     4 2    SAB TAB Ectopic Multiple Live Births         1         Home Medications    Prior to Admission medications   Medication Sig Start Date End Date Taking? Authorizing Provider  cyclobenzaprine (FLEXERIL) 10 MG tablet TAKE 1 TABLET BY MOUTH 3  TIMES DAILY AS NEEDED FOR MUSCLE SPASMS. Patient not taking: Reported on 09/17/2016 08/08/16   Jaclyn ShaggyEnobong Amao, MD  diphenhydrAMINE (BENADRYL) 25 MG tablet Take 1 tablet (25 mg total) by mouth every 6 (six) hours. 03/23/17   Azalia BilisKevin Campos, MD  famotidine (PEPCID) 20 MG tablet Take 1 tablet (20 mg total) by mouth 2 (two) times daily. 03/23/17   Azalia BilisKevin Campos, MD  gabapentin (NEURONTIN) 100 MG capsule Take 1 capsule (100 mg total) by mouth 3 (three) times daily. Patient not taking: Reported on 09/17/2016 09/28/14   Doris Cheadleeepak Advani, MD  predniSONE (DELTASONE) 10 MG tablet Take 6 tablets (60 mg total) by mouth daily. 03/23/17   Azalia BilisKevin Campos, MD  Vitamin D, Ergocalciferol, (DRISDOL) 50000 units CAPS capsule Take 1 capsule (50,000 Units total) by mouth every 7 (seven) days. 10/04/16   Jaclyn ShaggyEnobong Amao, MD    Family History Family History  Problem Relation Age of Onset  . Hypertension Father   . Diabetes Paternal Aunt     Social History Social History  Substance Use Topics  . Smoking status: Never Smoker  . Smokeless tobacco: Never Used  . Alcohol use No     Allergies   Patient has no known allergies.   Review of Systems Review of Systems  Constitutional: Negative for chills and fever.  Respiratory: Negative for cough, chest tightness and shortness of  breath.   Cardiovascular: Negative for chest pain, palpitations and leg swelling.  Gastrointestinal: Negative for abdominal pain, diarrhea, nausea and vomiting.  Musculoskeletal: Negative for arthralgias, myalgias, neck pain and neck stiffness.  Skin: Positive for rash.  Neurological: Negative for dizziness, weakness and headaches.  All other systems reviewed and are negative.    Physical Exam Updated Vital Signs BP 123/69 (BP Location: Right Arm)   Pulse 60   Temp 98.2 F (36.8 C) (Oral)   Resp 20   LMP 03/23/2017   SpO2 98%   Physical Exam  Constitutional: She appears well-developed and well-nourished. No distress.  HENT:  Head:  Normocephalic.  Eyes: Conjunctivae are normal.  Neck: Neck supple.  Cardiovascular: Normal rate, regular rhythm and normal heart sounds.   Pulmonary/Chest: Effort normal and breath sounds normal. No respiratory distress. She has no wheezes. She has no rales.  Abdominal: Soft. Bowel sounds are normal. She exhibits no distension. There is no tenderness. There is no rebound.  Musculoskeletal: She exhibits no edema.  Neurological: She is alert.  Skin: Skin is warm and dry.  Erythematous macular rash over bilateral arms, thighs, chest, lower abdomen. No rash over her back, upper abdomen, lower legs. No rash to the soles of her feet or palms of her hands. Rash is blanching.  Psychiatric: She has a normal mood and affect. Her behavior is normal.  Nursing note and vitals reviewed.    ED Treatments / Results  Labs (all labs ordered are listed, but only abnormal results are displayed) Labs Reviewed - No data to display  EKG  EKG Interpretation None       Radiology No results found.  Procedures Procedures (including critical care time)  Medications Ordered in ED Medications  dexamethasone (DECADRON) injection 10 mg (not administered)  diphenhydrAMINE (BENADRYL) injection 50 mg (not administered)     Initial Impression / Assessment and Plan / ED Course  I have reviewed the triage vital signs and the nursing notes.  Pertinent labs & imaging results that were available during my care of the patient were reviewed by me and considered in my medical decision making (see chart for details).   patient with persistent hives, was seen 2 days ago, started on prednisone, Benadryl, Pepcid. No evidence of angioedema. No swelling of the lips, tongue. No oral mucosal lesions. Do not think Viviann Sp[facility]. Will give IM Decadron and Benadryl and see if symptoms improve.    Patient monitored, rashes fading, patient feels better. Plan to discharge home with continued Benadryl, prednisone, Pepcid,  follow-up with her PCP or allergist. Discussed possible causes of this reaction and the only thing patient can think of is the new juice she had. Advised to avoid juice. Also advised to use hypoallergenic products. Strict return precautions discussed. Interpreter was used to go over the plan and all questions answered.    Vitals:   03/24/17 1013 03/24/17 1206  BP: 123/69 106/66  Pulse: 60 (!) 59  Resp: 20 19  Temp: 98.2 F (36.8 C)   TempSrc: Oral   SpO2: 98% 100%    Final Clinical Impressions(s) / ED Diagnoses   Final diagnoses:  Urticaria    New Prescriptions New Prescriptions   No medications on file     Jaynie Crumble, PA-C 03/24/17 1312    Laurence Spates, MD 03/24/17 [phone]

## 2017-03-25 ENCOUNTER — Ambulatory Visit: Payer: Self-pay

## 2017-03-25 ENCOUNTER — Encounter: Payer: Self-pay | Admitting: Family Medicine

## 2017-03-25 ENCOUNTER — Ambulatory Visit: Payer: Self-pay | Attending: Family Medicine | Admitting: Family Medicine

## 2017-03-25 VITALS — BP 134/84 | HR 71 | Temp 98.3°F | Resp 16 | Wt 213.0 lb

## 2017-03-25 DIAGNOSIS — L509 Urticaria, unspecified: Secondary | ICD-10-CM | POA: Insufficient documentation

## 2017-03-25 DIAGNOSIS — T7840XA Allergy, unspecified, initial encounter: Secondary | ICD-10-CM | POA: Insufficient documentation

## 2017-03-25 MED ORDER — CAMPHOR-MENTHOL 0.5-0.5 % EX LOTN: 1.0000 "application " | TOPICAL_LOTION | CUTANEOUS | 0 refills | Status: DC | PRN

## 2017-03-25 MED ORDER — EPINEPHRINE 0.3 MG/0.3ML IJ SOAJ: 0.3000 mg | Freq: Once | INTRAMUSCULAR | 0 refills | Status: AC

## 2017-03-25 NOTE — Progress Notes (Signed)
Subjective:  Patient ID: Sierra Novak, female    DOB: 1976-09-14  Age: 41 y.o. MRN: [id number]  CC: Urticaria   HPI Sierra Novak presentsToday after an ED visit where she was seen for generalized pruritic rash which began 3 days ago with progression to involve her lower extremities and trunk. She received a prescription for prednisone and Benadryl and also received IM Decadron. The rash has improved slightly but is still pruritic.  She can only recall drinking a combination of blueberry and grape juice (which was new) and eating an egg prior to onset of her rash 3 days ago; denies swelling of the lip, dysphagia or dyspnea. She has not had similar reactions in the past and denies previous allergies to foods, creams.  No past medical history on file.  Past Surgical History:  Procedure Laterality Date  . CESAREAN SECTION    . CHOLECYSTECTOMY      No Known Allergies   Outpatient Medications Prior to Visit  Medication Sig Dispense Refill  . diphenhydrAMINE (BENADRYL) 25 MG tablet Take 1 tablet (25 mg total) by mouth every 6 (six) hours. 12 tablet 0  . famotidine (PEPCID) 20 MG tablet Take 1 tablet (20 mg total) by mouth 2 (two) times daily. 10 tablet 0  . predniSONE (DELTASONE) 10 MG tablet Take 6 tablets (60 mg total) by mouth daily. 30 tablet 0  . cyclobenzaprine (FLEXERIL) 10 MG tablet TAKE 1 TABLET BY MOUTH 3 TIMES DAILY AS NEEDED FOR MUSCLE SPASMS. (Patient not taking: Reported on 09/17/2016) 30 tablet 0  . gabapentin (NEURONTIN) 100 MG capsule Take 1 capsule (100 mg total) by mouth 3 (three) times daily. (Patient not taking: Reported on 09/17/2016) 90 capsule 3  . Vitamin D, Ergocalciferol, (DRISDOL) 50000 units CAPS capsule Take 1 capsule (50,000 Units total) by mouth every 7 (seven) days. 12 capsule 0   No facility-administered medications prior to visit.     ROS Review of Systems  Constitutional: Negative for activity change and appetite change.  HENT:  Negative for sinus pressure and sore throat.   Respiratory: Negative for chest tightness, shortness of breath and wheezing.   Cardiovascular: Negative for chest pain and palpitations.  Gastrointestinal: Negative for abdominal distention, abdominal pain and constipation.  Genitourinary: Negative.   Musculoskeletal: Negative.   Skin: Positive for rash.  Psychiatric/Behavioral: Negative for behavioral problems and dysphoric mood.    Objective:  BP 134/84 (BP Location: Right Arm, Patient Position: Sitting, Cuff Size: Large)   Pulse 71   Temp 98.3 F (36.8 C) (Oral)   Resp 16   Wt 213 lb (96.6 kg)   LMP 03/23/2017   SpO2 100%   BMI 37.14 kg/m   BP/Weight 03/25/2017 03/24/2017 03/23/2017  Systolic BP 134 108 115  Diastolic BP 84 66 76  Wt. (Lbs) 213 - -  BMI 37.14 - -      Physical Exam  Constitutional: She is oriented to person, place, and time. She appears well-developed and well-nourished.  Cardiovascular: Normal rate, normal heart sounds and intact distal pulses.   No murmur heard. Pulmonary/Chest: Effort normal and breath sounds normal. She has no wheezes. She has no rales. She exhibits no tenderness.  Abdominal: Soft. Bowel sounds are normal. She exhibits no distension and no mass. There is no tenderness.  Musculoskeletal: Normal range of motion.  Neurological: She is alert and oriented to person, place, and time.  Skin:  Erythematous papular rash which is generalized and sparing the face.  Assessment & Plan:   1. Allergic reaction, initial encounter Continue prednisone We'll need to assess for improvement of symptoms - strict return and ED precautions have been discussed. - camphor-menthol (SARNA) lotion; Apply 1 application topically as needed for itching.  Dispense: 222 mL; Refill: 0 - EPINEPHrine 0.3 mg/0.3 mL IJ SOAJ injection; Inject 0.3 mLs (0.3 mg total) into the muscle once.  Dispense: 1 Device; Refill: 0 - Food Allergy Profile   Meds ordered this  encounter  Medications  . camphor-menthol (SARNA) lotion    Sig: Apply 1 application topically as needed for itching.    Dispense:  222 mL    Refill:  0  . EPINEPHrine 0.3 mg/0.3 mL IJ SOAJ injection    Sig: Inject 0.3 mLs (0.3 mg total) into the muscle once.    Dispense:  1 Device    Refill:  0    Follow-up: Return in about 1 week (around 04/01/2017) for follow up allergic reaction.   Jaclyn Shaggy MD

## 2017-03-25 NOTE — Patient Instructions (Signed)
Alergias en los adultos  (Allergies, Adult)  Una alergia se produce cuando el sistema de defensa del cuerpo (sistema inmunitario) reacciona en exceso a una sustancia normalmente inofensiva (alérgeno), que se respira o se come, o entra en contacto con la piel. Al estar en contacto con algo a lo que se es alérgico, el sistema inmunitario produce ciertas proteínas (anticuerpos). Estas proteínas hacen que las células liberen sustancias químicas (histaminas) que desencadenan los síntomas de una reacción alérgica.  Las alergias suelen afectar las fosas nasales (rinitis alérgica), los ojos (conjuntivitis alérgica), la piel (dermatitis atópica) y el estómago. Las alergias pueden ser leves o graves. No se pueden diseminar de una persona a otra (no es contagiosa). Pueden aparecer a cualquier edad y se pueden superar con los años.  CAUSAS  La causa de las alergias puede ser cualquier sustancia que el sistema inmunitario identifica erróneamente como dañina. Estas pueden incluir lo siguiente:  · Alérgenos externos, como el polen, el pasto, las malezas, el escape de gases de automóviles y esporas del moho.  · Alérgenos internos, como el polvo, el humo, el moho y la caspa de las mascotas.  · Alimentos, específicamente el maní, la leche, los huevos, el pescado, los mariscos, la soja, las nueces y el trigo.  · Medicamentos, como la penicilina.  · Irritantes de la piel, como los detergentes, las sustancias químicas y el látex.  · Perfume.  · Las picaduras o mordeduras de insectos.  FACTORES DE RIESGO  Puede tener un riesgo mayor de sufrir alergias si otras personas de su familia tienen alergias.  SÍNTOMAS  Los síntomas dependen del tipo de alergia que tenga. Estos pueden incluir los siguientes:  · Secreción o congestión nasal.  · Estornudos.  · Picazón en la boca, los oídos o la garganta.  · Goteo posnasal.  · Dolor de garganta.  · Ojos rojos, lagrimosos, hinchados o con picazón.  · Erupción o ronchas en la piel.  · Dolor de  estómago.  · Vómitos.  · Diarrea.  · Meteorismo.  · Tos o sibilancias.  Las personas con una alergia grave a los alimentos, los medicamentos o la picadura de insectos pueden tener una reacción alérgica potencialmente mortal (anafilaxia). Los síntomas de la anafilaxia incluyen:  · Ronchas.  · Picazón.  · Cara enrojecida.  · Labios, lengua o boca hinchados.  · Hinchazón u opresión en la garganta.  · Dolor u opresión en el pecho.  · Dificultad para respirar o falta de aire.  · Latidos cardíacos rápidos.  · Mareos o desmayos.  · Vómitos.  · Diarrea.  · Dolor en el abdomen.  DIAGNÓSTICO  Esta afección se diagnostica en función de lo siguiente:  · Sus síntomas.  · Sus antecedentes médicos y familiares.  · Un examen físico.  Es posible que necesite ver a un médico especialista en el tratamiento de alergias (alergista). También pueden hacerle exámenes que incluyen:  · Pruebas de piel para detectar qué alérgenos causan los síntomas. por ejemplo:  ? Prueba de punción. En esta prueba, se pincha la piel con una aguja diminuta para exponerla a pequeñas cantidades de posibles alérgenos y ver si la piel reacciona.  ? Prueba cutánea intradérmica. En esta prueba, se inyecta una pequeña cantidad de alérgeno debajo de la piel para ver si esta reacciona.  ? Prueba de parche. En esta prueba, se coloca una pequeña cantidad de alérgeno en la piel y luego se cubre con una venda. El médico examinará la piel después de un par de   días para ver si hay una erupción cutánea.  · Análisis de sangre.  · Pruebas de provocación. En esta prueba, debe inhalar una pequeña cantidad de alérgeno por boca para ver si produce una reacción alérgica.  También pueden pedirle que:  · Lleve un registro de alimentos. Un registro de alimentos incluye todos los alimentos y las bebidas que usted consume en un día, y cualquier síntoma que experimenta.  · Practique una dieta de eliminación. Una dieta de eliminación implica eliminar alimentos específicos de la dieta y  luego incorporarlos nuevamente, uno a la vez, para averiguar si hay uno en particular que le cause una reacción alérgica.  TRATAMIENTO  El tratamiento para las alergias depende de los síntomas. El tratamiento puede incluir lo siguiente:  · Compresas frías para aliviar la picazón y la hinchazón.  · Gotas oftálmicas.  · Aerosoles nasales.  · Usar un aerosol salino o lota nasal (neti pot) para lavar la nariz (irrigación nasal). Estos métodos pueden ayudar a eliminar mucosidad y mantener las fosas nasales húmedas.  · El uso de un humidificador.  · Antihistamínicos orales u otros medicamentos para detener la reacción alérgica y la inflamación.  · Cremas para la piel para tratar las erupciones o la picazón.  · Cambios en la dieta para eliminar los desencadenantes de la alergia a los alimentos.  · Exposición repetida a cantidades diminutas de alérgenos para generar tolerancia y prevenir reacciones alérgicas futuras (inmunoterapia). Estos incluyen los siguientes:  ? Vacunas contra la alergia.  ? Tratamiento por vía oral. Esto incluye pequeñas dosis de un alérgeno debajo de la lengua (inmunoterapia sublingual).  · Inyección de epinefrina de emergencia (autoinyector) en caso de una emergencia alérgica. Se trata de un medicamento autoinyectable y medido previamente que se debe administrar en los primeros minutos de una reacción alérgica grave.  INSTRUCCIONES PARA EL CUIDADO EN EL HOGAR  · Evite los alérgenos conocidos, siempre que sea posible.  · Si sufre de alergia por alérgenos que están en el aire, lávese la nariz a diario. Puede usar un aerosol o una lota nasal (neti pot) para lavar la nariz (irrigación nasal).  · Tome los medicamentos de venta libre y los recetados solamente como se lo haya indicado el médico.  · Concurra a todas las visitas de control como se lo haya indicado el médico. Esto es importante.  · Si tiene riesgo de sufrir una reacción alérgica grave (anafilaxia), lleve su autoinyector con usted en todo  momento.  · Si alguna vez ha tenido anafilaxia, use una pulsera o collar de alerta médica que diga que usted tiene una alergia grave.    SOLICITE ATENCIÓN MÉDICA SI:  · Los síntomas no mejoran con el tratamiento.    SOLICITE ATENCIÓN MÉDICA DE INMEDIATO SI:  · Tiene síntomas de anafilaxia, como los siguientes:  ? Boca, lengua o garganta hinchadas.  ? Dolor u opresión en el pecho.  ? Dificultad para respirar o falta de aire.  ? Mareos o desmayos.  ? Dolor abdominal intenso, vómitos o diarrea.    Esta información no tiene como fin reemplazar el consejo del médico. Asegúrese de hacerle al médico cualquier pregunta que tenga.  Document Released: 12/17/2005 Document Revised: 01/07/2015 Document Reviewed: 07/04/2016  Elsevier Interactive Patient Education © 2017 [employer]

## 2017-03-29 LAB — FOOD ALLERGY PROFILE
Allergen Corn, IgE: 0.18 kU/L — AB
Clam IgE: <0.1 kU/L
Egg White IgE: 0.15 kU/L — AB
Peanut IgE: <0.1 kU/L
SHRIMP IGE: 1.2 kU/L — AB
Sesame Seed IgE: <0.1 kU/L
Wheat IgE: <0.1 kU/L

## 2017-04-01 MED FILL — EPIPEN 2-PAK 0.3 MG AUTO-IN: 0.3 | 30 days supply | Qty: 2 | Fill #0

## 2017-04-11 ENCOUNTER — Telehealth: Payer: Self-pay | Admitting: Family Medicine

## 2017-04-11 NOTE — Telephone Encounter (Signed)
Patient came by the office asking to speak with nurse. Pt stated that nurse had asked her to come to get her results from her lab work. There was no docs to give patient. Please follow up.  Thank you.

## 2017-04-12 NOTE — Telephone Encounter (Signed)
Writer sent patient's lab results and highlighted what she is allergic to and Dr. Jen Mow recommendations.

## 2017-04-12 NOTE — Telephone Encounter (Signed)
Patient called this morning and asked that results be mailed.   Thank you.

## 2017-04-16 ENCOUNTER — Ambulatory Visit: Payer: Self-pay | Attending: Family Medicine | Admitting: Family Medicine

## 2017-04-16 ENCOUNTER — Encounter: Payer: Self-pay | Admitting: Family Medicine

## 2017-04-16 VITALS — BP 123/81 | HR 74 | Temp 98.4°F | Resp 18 | Ht 63.0 in | Wt 210.0 lb

## 2017-04-16 DIAGNOSIS — Z0189 Encounter for other specified special examinations: Secondary | ICD-10-CM | POA: Insufficient documentation

## 2017-04-16 DIAGNOSIS — Z13228 Encounter for screening for other metabolic disorders: Secondary | ICD-10-CM | POA: Insufficient documentation

## 2017-04-16 DIAGNOSIS — T7840XD Allergy, unspecified, subsequent encounter: Secondary | ICD-10-CM

## 2017-04-16 DIAGNOSIS — T7840XA Allergy, unspecified, initial encounter: Secondary | ICD-10-CM | POA: Insufficient documentation

## 2017-04-16 DIAGNOSIS — R21 Rash and other nonspecific skin eruption: Secondary | ICD-10-CM | POA: Insufficient documentation

## 2017-04-16 DIAGNOSIS — Z91013 Allergy to seafood: Secondary | ICD-10-CM | POA: Insufficient documentation

## 2017-04-16 NOTE — Progress Notes (Signed)
Patient is here for allergic reaction.  Patient denies pain at this time.  Patient has taken medication today. Patient has eaten today.

## 2017-04-16 NOTE — Progress Notes (Signed)
Subjective:  Patient ID: Sierra Novak, female    DOB: 1976/10/31  Age: 41 y.o. MRN: [id number]  CC: Allergic Reaction   HPI Sierra Novak was seen at her last visit for an ED follow-up which was managed by an allergic reaction. Allergy panel revealed elevated white IgE and shrimp IgE. She reports improvement in rash but does have some residual hives and her left side; continues to use Claritin and Sarna lotion. Denies fever, shortness of breath or edema  No past medical history on file.  Past Surgical History:  Procedure Laterality Date  . CESAREAN SECTION    . CHOLECYSTECTOMY      Allergies  Allergen Reactions  . Shrimp [Shellfish Allergy]       Outpatient Medications Prior to Visit  Medication Sig Dispense Refill  . camphor-menthol (SARNA) lotion Apply 1 application topically as needed for itching. 222 mL 0  . cyclobenzaprine (FLEXERIL) 10 MG tablet TAKE 1 TABLET BY MOUTH 3 TIMES DAILY AS NEEDED FOR MUSCLE SPASMS. (Patient not taking: Reported on 09/17/2016) 30 tablet 0  . diphenhydrAMINE (BENADRYL) 25 MG tablet Take 1 tablet (25 mg total) by mouth every 6 (six) hours. 12 tablet 0  . famotidine (PEPCID) 20 MG tablet Take 1 tablet (20 mg total) by mouth 2 (two) times daily. 10 tablet 0  . predniSONE (DELTASONE) 10 MG tablet Take 6 tablets (60 mg total) by mouth daily. 30 tablet 0  . Vitamin D, Ergocalciferol, (DRISDOL) 50000 units CAPS capsule Take 1 capsule (50,000 Units total) by mouth every 7 (seven) days. 12 capsule 0  . gabapentin (NEURONTIN) 100 MG capsule Take 1 capsule (100 mg total) by mouth 3 (three) times daily. (Patient not taking: Reported on 09/17/2016) 90 capsule 3   No facility-administered medications prior to visit.     ROS Review of Systems  Constitutional: Negative for activity change and appetite change.  HENT: Negative for sinus pressure and sore throat.   Respiratory: Negative for chest tightness, shortness of breath and  wheezing.   Cardiovascular: Negative for chest pain and palpitations.  Gastrointestinal: Negative for abdominal distention, abdominal pain and constipation.  Genitourinary: Negative.   Musculoskeletal: Negative.   Skin:       See hpi  Psychiatric/Behavioral: Negative for behavioral problems and dysphoric mood.    Objective:  BP 123/81 (BP Location: Left Arm, Patient Position: Sitting, Cuff Size: Normal)   Pulse 74   Temp 98.4 F (36.9 C) (Oral)   Resp 18   Ht  (1.6 m)   Wt 210 lb (95.3 kg)   LMP 03/27/2017   SpO2 100%   BMI 37.20 kg/m   BP/Weight 04/16/2017 03/25/2017 03/24/2017  Systolic BP 123 134 108  Diastolic BP 81 84 66  Wt. (Lbs) 210 213 -  BMI 37.2 37.14 -      Physical Exam  Constitutional: She is oriented to person, place, and time. She appears well-developed and well-nourished.  Cardiovascular: Normal rate, normal heart sounds and intact distal pulses.   No murmur heard. Pulmonary/Chest: Effort normal and breath sounds normal. She has no wheezes. She has no rales. She exhibits no tenderness.  Abdominal: Soft. Bowel sounds are normal. She exhibits no distension and no mass. There is no tenderness.  Musculoskeletal: Normal range of motion.  Neurological: She is alert and oriented to person, place, and time.     Assessment & Plan:   1. 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 Screening for metabolic disorder - Comprehensive metabolic panel; Future - Lipid panel; Future   No orders of the defined types were placed in this encounter.   Follow-up: Return in about 3 months (around 07/16/2017) for coordination of care.   Jaclyn Shaggy MD

## 2017-04-17 ENCOUNTER — Ambulatory Visit: Payer: Self-pay | Attending: Family Medicine

## 2017-04-17 DIAGNOSIS — Z13228 Encounter for screening for other metabolic disorders: Secondary | ICD-10-CM | POA: Insufficient documentation

## 2017-04-17 NOTE — Progress Notes (Signed)
Patient here for lab visit only 

## 2017-04-18 LAB — COMPREHENSIVE METABOLIC PANEL
ALT: 21 IU/L (ref 0–32)
AST: 17 IU/L (ref 0–40)
Albumin/Globulin Ratio: 1.6 (ref 1.2–2.2)
Albumin: 4.2 g/dL (ref 3.5–5.5)
Alkaline Phosphatase: 56 IU/L (ref 39–117)
BUN/Creatinine Ratio: 15 (ref 9–23)
BUN: 8 mg/dL (ref 6–24)
Bilirubin Total: 0.5 mg/dL (ref 0.0–1.2)
CALCIUM: 9.3 mg/dL (ref 8.7–10.2)
CO2: 24 mmol/L (ref 18–29)
CREATININE: 0.54 mg/dL — AB (ref 0.57–1.00)
Chloride: 105 mmol/L (ref 96–106)
GFR calc Af Amer: 136 mL/min/{1.73_m2} (ref >59)
GFR, EST NON AFRICAN AMERICAN: 118 mL/min/{1.73_m2} (ref >59)
GLOBULIN, TOTAL: 2.6 g/dL (ref 1.5–4.5)
Glucose: 97 mg/dL (ref 65–99)
Potassium: 4.3 mmol/L (ref 3.5–5.2)
SODIUM: 140 mmol/L (ref 134–144)
Total Protein: 6.8 g/dL (ref 6.0–8.5)

## 2017-04-18 LAB — LIPID PANEL
CHOL/HDL RATIO: 4.1 ratio (ref 0.0–4.4)
Cholesterol, Total: 187 mg/dL (ref 100–199)
HDL: 46 mg/dL (ref >39)
LDL CALC: 125 mg/dL — AB (ref 0–99)
TRIGLYCERIDES: 80 mg/dL (ref 0–149)
VLDL Cholesterol Cal: 16 mg/dL (ref 5–40)

## 2017-05-01 ENCOUNTER — Telehealth: Payer: Self-pay

## 2017-05-01 NOTE — Telephone Encounter (Signed)
Through [facility] [phone] lab results were given to patient.  Patient stated understanding.

## 2017-05-01 NOTE — Telephone Encounter (Signed)
-----   Message from Jaclyn Shaggy, MD sent at 04/18/2017  4:15 PM EDT ----- Please inform the patient that labs are normal. Thank you.

## 2017-05-15 ENCOUNTER — Encounter: Payer: Self-pay | Admitting: Gynecology

## 2018-05-19 ENCOUNTER — Emergency Department (HOSPITAL_COMMUNITY)
Admission: EM | Admit: 2018-05-19 | Discharge: 2018-05-20 | Disposition: A | Discharge status: Home / Self Care | Payer: Self-pay | Attending: Emergency Medicine | Admitting: Emergency Medicine

## 2018-05-19 DIAGNOSIS — Y999 Unspecified external cause status: Secondary | ICD-10-CM | POA: Insufficient documentation

## 2018-05-19 DIAGNOSIS — Y929 Unspecified place or not applicable: Secondary | ICD-10-CM | POA: Insufficient documentation

## 2018-05-19 DIAGNOSIS — X58XXXA Exposure to other specified factors, initial encounter: Secondary | ICD-10-CM | POA: Insufficient documentation

## 2018-05-19 DIAGNOSIS — Y939 Activity, unspecified: Secondary | ICD-10-CM | POA: Insufficient documentation

## 2018-05-19 DIAGNOSIS — S0501XA Injury of conjunctiva and corneal abrasion without foreign body, right eye, initial encounter: Secondary | ICD-10-CM | POA: Insufficient documentation

## 2018-05-19 NOTE — ED Triage Notes (Signed)
Patient presents to ED for assessment of right eye filled with blood.  Patient states she got in a hot car on Saturday and was overwhelmed by it, and when she got home her son noted her eye had a blood shot look.  Patient denies vision loss, denies pain, but states she wanted to be sure everything was okay"

## 2018-05-20 MED ORDER — ERYTHROMYCIN 5 MG/GM OP OINT: TOPICAL_OINTMENT | OPHTHALMIC | 0 refills | Status: DC

## 2018-05-20 MED ORDER — FLUORESCEIN SODIUM 1 MG OP STRP
1.0000 | ORAL_STRIP | Freq: Once | OPHTHALMIC | Status: AC
  Administered 2018-05-20: 1 via OPHTHALMIC
  Filled 2018-05-20: qty 1

## 2018-05-20 MED ORDER — TETRACAINE HCL 0.5 % OP SOLN
2.0000 [drp] | Freq: Once | OPHTHALMIC | Status: AC
  Administered 2018-05-20: 2 [drp] via OPHTHALMIC
  Filled 2018-05-20: qty 4

## 2018-05-20 NOTE — ED Provider Notes (Signed)
[facility] EMERGENCY DEPARTMENT Provider Note   CSN: [id number] Arrival date & time: 05/19/18  2043     History   Chief Complaint Chief Complaint  Patient presents with  . Eye Pain    HPI Sierra Novak is a 42 y.o. female.  The history is provided by the patient and medical records. A language interpreter was used (Spanish video interpreter).   Sierra Novak is an otherwise healthy 42 y.o. female who presents to ED for right eye redness x 2 days. Patient states that she was in the car for a long time on Saturday and the car was very hot. She never felt as if she got something in her eye. She came home and her son informed her that her eye was red. She did not know anything had happened. She denies any visual change, eye pain, photophobia or discharge from the eye.   No past medical history on file.  Patient Active Problem List   Diagnosis Date Noted  . Numbness and tingling 09/28/2014  . Vitamin D deficiency 09/28/2014  . Headache(784.0) 03/22/2014    Past Surgical History:  Procedure Laterality Date  . CESAREAN SECTION    . CHOLECYSTECTOMY       OB History    Gravida  5   Para  1   Term      Preterm      AB  4   Living  2     SAB      TAB      Ectopic      Multiple  1   Live Births               Home Medications    Prior to Admission medications   Medication Sig Start Date End Date Taking? Authorizing Provider  camphor-menthol Wynelle Fanny) lotion Apply 1 application topically as needed for itching. 03/25/17   Hoy Register, MD  cyclobenzaprine (FLEXERIL) 10 MG tablet TAKE 1 TABLET BY MOUTH 3 TIMES DAILY AS NEEDED FOR MUSCLE SPASMS. Patient not taking: Reported on 09/17/2016 08/08/16   Hoy Register, MD  diphenhydrAMINE (BENADRYL) 25 MG tablet Take 1 tablet (25 mg total) by mouth every 6 (six) hours. 03/23/17   Azalia Bilis, MD  erythromycin ophthalmic ointment Place a 1/2 inch ribbon of ointment into the  lower eyelid 3-4x per day. 05/20/18   Ninfa Giannelli, Chase Picket, PA-C  famotidine (PEPCID) 20 MG tablet Take 1 tablet (20 mg total) by mouth 2 (two) times daily. 03/23/17   Azalia Bilis, MD  predniSONE (DELTASONE) 10 MG tablet Take 6 tablets (60 mg total) by mouth daily. 03/23/17   Azalia Bilis, MD  Vitamin D, Ergocalciferol, (DRISDOL) 50000 units CAPS capsule Take 1 capsule (50,000 Units total) by mouth every 7 (seven) days. 10/04/16   Hoy Register, MD    Family History Family History  Problem Relation Age of Onset  . Hypertension Father   . Diabetes Paternal Aunt     Social History Social History   Tobacco Use  . Smoking status: Never Smoker  . Smokeless tobacco: Never Used  Substance Use Topics  . Alcohol use: No    Alcohol/week: 0.0 oz  . Drug use: No     Allergies   Shrimp [shellfish allergy]   Review of Systems Review of Systems  Eyes: Positive for redness. Negative for photophobia, pain and visual disturbance.     Physical Exam Updated Vital Signs BP 139/77 (BP Location: Right Arm)  Pulse 68   Temp 99.7 F (37.6 C) (Oral)   Resp 16   SpO2 100%   Physical Exam  Constitutional: She appears well-developed and well-nourished. No distress.  HENT:  Head: Normocephalic and atraumatic.  Eyes: Pupils are equal, round, and reactive to light. Lids are normal. Lids are everted and swept, no foreign bodies found. No foreign body present in the right eye. No foreign body present in the left eye.  EOMI without pain. No consensual photophobia. Right eye with subconjunctival hemorrhage. Fluorescein uptake c/w abrasion to right eye.   Neck: Neck supple.  Cardiovascular: Normal rate, regular rhythm and normal heart sounds.  No murmur heard. Pulmonary/Chest: Effort normal and breath sounds normal. No respiratory distress. She has no wheezes. She has no rales.  Musculoskeletal: Normal range of motion.  Neurological: She is alert.  Skin: Skin is warm and dry.  Nursing note and  vitals reviewed.    ED Treatments / Results  Labs (all labs ordered are listed, but only abnormal results are displayed) Labs Reviewed - No data to display  EKG None  Radiology No results found.  Procedures Procedures (including critical care time)  Medications Ordered in ED Medications  tetracaine (PONTOCAINE) 0.5 % ophthalmic solution 2 drop (2 drops Right Eye Given 05/20/18 0046)  fluorescein ophthalmic strip 1 strip (1 strip Right Eye Given 05/20/18 0046)     Initial Impression / Assessment and Plan / ED Course  I have reviewed the triage vital signs and the nursing notes.  Pertinent labs & imaging results that were available during my care of the patient were reviewed by me and considered in my medical decision making (see chart for details).  Sierra Novak is a 42 y.o. female who presents to ED for redness to right eye. Fluorescein uptake c/w corneal abrasion on exam. No evidence of foreign body. Exam not concerning for orbital cellulitis or hyphema. No concern for uveitis. No visual changes and visual acuity normal today. Patient will be discharged home with erythromycin ointment.  Patient understands to follow up with ophthalmology. Return precautions discussed. Patient appears safe for discharge and all questions were answered.   Final Clinical Impressions(s) / ED Diagnoses   Final diagnoses:  Abrasion of right cornea, initial encounter    ED Discharge Orders        Ordered    erythromycin ophthalmic ointment     05/20/18 0058       Rex Magee, Chase Picket, PA-C 05/20/18 0101    Gilda Crease, MD 05/20/18 [phone]

## 2018-05-20 NOTE — ED Notes (Signed)
Visual acuity Right eye 20/25                      Left eye 20/20 

## 2018-05-20 NOTE — Discharge Instructions (Signed)
It was my pleasure taking care of you today!   Apply ointment to the eye as directed.   Call the eye doctor listed tomorrow to schedule a follow up appointment.   Return to ER for new or worsening symptoms, any additional concerns.

## 2019-01-05 ENCOUNTER — Telehealth: Payer: Self-pay | Admitting: Family Medicine

## 2019-01-05 NOTE — Telephone Encounter (Signed)
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 Called in to verify the appt. °

## 2019-01-13 ENCOUNTER — Emergency Department (HOSPITAL_COMMUNITY): Payer: Medicaid Other

## 2019-01-13 ENCOUNTER — Emergency Department (HOSPITAL_COMMUNITY)
Admission: EM | Admit: 2019-01-13 | Discharge: 2019-01-13 | Disposition: A | Discharge status: Home / Self Care | Payer: Medicaid Other | Attending: Emergency Medicine | Admitting: Emergency Medicine

## 2019-01-13 ENCOUNTER — Encounter (HOSPITAL_COMMUNITY): Payer: Self-pay | Admitting: Emergency Medicine

## 2019-01-13 ENCOUNTER — Other Ambulatory Visit: Payer: Self-pay

## 2019-01-13 DIAGNOSIS — Z3A11 11 weeks gestation of pregnancy: Secondary | ICD-10-CM | POA: Diagnosis not present

## 2019-01-13 DIAGNOSIS — Z79899 Other long term (current) drug therapy: Secondary | ICD-10-CM | POA: Insufficient documentation

## 2019-01-13 DIAGNOSIS — N939 Abnormal uterine and vaginal bleeding, unspecified: Secondary | ICD-10-CM

## 2019-01-13 DIAGNOSIS — O039 Complete or unspecified spontaneous abortion without complication: Secondary | ICD-10-CM | POA: Diagnosis not present

## 2019-01-13 LAB — URINALYSIS, ROUTINE W REFLEX MICROSCOPIC
Bilirubin Urine: NEGATIVE
GLUCOSE, UA: NEGATIVE mg/dL
KETONES UR: NEGATIVE mg/dL
Leukocytes, UA: NEGATIVE
NITRITE: NEGATIVE
PH: 7 (ref 5.0–8.0)
PROTEIN: NEGATIVE mg/dL
Specific Gravity, Urine: 1.006 (ref 1.005–1.030)

## 2019-01-13 LAB — WET PREP, GENITAL
SPERM: NONE SEEN
TRICH WET PREP: NONE SEEN
YEAST WET PREP: NONE SEEN

## 2019-01-13 LAB — TYPE AND SCREEN
ABO/RH(D): O POS
ANTIBODY SCREEN: NEGATIVE

## 2019-01-13 LAB — CBC WITH DIFFERENTIAL/PLATELET
ABS IMMATURE GRANULOCYTES: 0.02 10*3/uL (ref 0.00–0.07)
BASOS PCT: 0 %
Basophils Absolute: 0 10*3/uL (ref 0.0–0.1)
Eosinophils Absolute: 0 10*3/uL (ref 0.0–0.5)
Eosinophils Relative: 0 %
HEMATOCRIT: 39.2 % (ref 36.0–46.0)
HEMOGLOBIN: 13.1 g/dL (ref 12.0–15.0)
IMMATURE GRANULOCYTES: 0 %
LYMPHS ABS: 1.1 10*3/uL (ref 0.7–4.0)
Lymphocytes Relative: 17 %
MCH: 31.5 pg (ref 26.0–34.0)
MCHC: 33.4 g/dL (ref 30.0–36.0)
MCV: 94.2 fL (ref 80.0–100.0)
MONO ABS: 0.7 10*3/uL (ref 0.1–1.0)
MONOS PCT: 12 %
NEUTROS ABS: 4.5 10*3/uL (ref 1.7–7.7)
NEUTROS PCT: 71 %
Platelets: 236 10*3/uL (ref 150–400)
RBC: 4.16 MIL/uL (ref 3.87–5.11)
RDW: 13.5 % (ref 11.5–15.5)
WBC: 6.4 10*3/uL (ref 4.0–10.5)
nRBC: 0 % (ref 0.0–0.2)

## 2019-01-13 LAB — I-STAT BETA HCG BLOOD, ED (MC, WL, AP ONLY): I-stat hCG, quantitative: 1775.9 m[IU]/mL — ABNORMAL HIGH (ref <5)

## 2019-01-13 LAB — HCG, QUANTITATIVE, PREGNANCY: HCG, BETA CHAIN, QUANT, S: 2169 m[IU]/mL — AB (ref <5)

## 2019-01-13 LAB — ABO/RH: ABO/RH(D): O POS

## 2019-01-13 LAB — PREGNANCY, URINE: PREG TEST UR: POSITIVE — AB

## 2019-01-13 MED ORDER — ACETAMINOPHEN 500 MG PO TABS
1000.0000 mg | ORAL_TABLET | Freq: Once | ORAL | Status: AC
  Administered 2019-01-13: 1000 mg via ORAL
  Filled 2019-01-13: qty 2

## 2019-01-13 MED ORDER — MISOPROSTOL 200 MCG PO TABS
800.0000 ug | ORAL_TABLET | Freq: Once | ORAL | Status: AC
  Administered 2019-01-13: 800 ug via ORAL
  Filled 2019-01-13: qty 4

## 2019-01-13 MED ORDER — TRAMADOL HCL 50 MG PO TABS: 50.0000 mg | ORAL_TABLET | Freq: Four times a day (QID) | ORAL | 0 refills | Status: DC | PRN

## 2019-01-13 MED ORDER — OXYCODONE HCL 5 MG PO TABS
5.0000 mg | ORAL_TABLET | Freq: Once | ORAL | Status: AC
  Administered 2019-01-13: 5 mg via ORAL
  Filled 2019-01-13: qty 1

## 2019-01-13 MED ORDER — ONDANSETRON HCL 4 MG PO TABS: 4.0000 mg | ORAL_TABLET | Freq: Four times a day (QID) | ORAL | 0 refills | Status: DC

## 2019-01-13 NOTE — ED Triage Notes (Signed)
Pt c/o cough, nasal congestion, generalized body aches since Saturday. Patient is pregnant (she thinks 8-12 weeks) but has had spotting since Sunday. No prenatal care yet, has appt on February 7. Has had four miscarriages, the most recent of which was 2007. LMP 10/26.

## 2019-01-13 NOTE — ED Notes (Signed)
Patient transported to Ultrasound 

## 2019-01-13 NOTE — Discharge Instructions (Addendum)
Please return for any problem.  Follow-up with your regular care provider as instructed.  Follow-up with your regular OB tomorrow at the [facility].

## 2019-01-13 NOTE — ED Notes (Signed)
Patient verbalizes understanding of discharge instructions. Opportunity for questioning and answers were provided. Armband removed by staff, pt discharged from ED.  

## 2019-01-13 NOTE — ED Notes (Signed)
Discharge completed by the EDP and RN with the assistance of the [employer] translation service.

## 2019-01-13 NOTE — ED Provider Notes (Addendum)
[facility] Provider Note   CSN: [id number] Arrival date & time: 01/13/19  1051     History   Chief Complaint Chief Complaint  Patient presents with  . Vaginal Bleeding  . Cough    HPI Sierra Novak is a 43 y.o. female.  43 year old female, Spanish-speaking, with prior medical history as detailed below presents for evaluation of URI symptoms.  Patient reports 3 to 4 days of runny nose, cough, congestion.  She also complains of brownish vaginal spotting.  This also started last 4 days.  She denies abdominal pain.  She denies vomiting or fever.  She reports that she is approximately 8 to [redacted] weeks pregnant.  She has not established OB care for this pregnancy.  This is her sixth pregnancy.  She has 4 prior miscarriages.  She has had 1 twin gestation became return.  She has, reportedly, an OB appointment for February 7.  The history is provided by the patient and medical records.  Vaginal Bleeding  Quality:  Spotting Severity:  Mild Onset quality:  Gradual Duration:  4 days Timing:  Intermittent Progression:  Waxing and waning Chronicity:  New Possible pregnancy: yes   Context: at rest   Relieved by:  Nothing Worsened by:  Nothing Ineffective treatments:  None tried Cough    No past medical history on file.  Patient Active Problem List   Diagnosis Date Noted  . Numbness and tingling 09/28/2014  . Vitamin D deficiency 09/28/2014  . Headache(784.0) 03/22/2014    Past Surgical History:  Procedure Laterality Date  . CESAREAN SECTION    . CHOLECYSTECTOMY       OB History    Gravida  6   Para  1   Term      Preterm      AB  4   Living  2     SAB      TAB      Ectopic      Multiple  1   Live Births               Home Medications    Prior to Admission medications   Medication Sig Start Date End Date Taking? Authorizing Provider  camphor-menthol Wynelle Fanny(SARNA) lotion Apply 1 application topically as  needed for itching. 03/25/17   Hoy RegisterNewlin, Enobong, MD  cyclobenzaprine (FLEXERIL) 10 MG tablet TAKE 1 TABLET BY MOUTH 3 TIMES DAILY AS NEEDED FOR MUSCLE SPASMS. Patient not taking: Reported on 09/17/2016 08/08/16   Hoy RegisterNewlin, Enobong, MD  diphenhydrAMINE (BENADRYL) 25 MG tablet Take 1 tablet (25 mg total) by mouth every 6 (six) hours. 03/23/17   Azalia Bilisampos, Kevin, MD  erythromycin ophthalmic ointment Place a 1/2 inch ribbon of ointment into the lower eyelid 3-4x per day. 05/20/18   Ward, Chase PicketJaime Pilcher, PA-C  famotidine (PEPCID) 20 MG tablet Take 1 tablet (20 mg total) by mouth 2 (two) times daily. 03/23/17   Azalia Bilisampos, Kevin, MD  predniSONE (DELTASONE) 10 MG tablet Take 6 tablets (60 mg total) by mouth daily. 03/23/17   Azalia Bilisampos, Kevin, MD  Vitamin D, Ergocalciferol, (DRISDOL) 50000 units CAPS capsule Take 1 capsule (50,000 Units total) by mouth every 7 (seven) days. 10/04/16   Hoy RegisterNewlin, Enobong, MD    Family History Family History  Problem Relation Age of Onset  . Hypertension Father   . Diabetes Paternal Aunt     Social History Social History   Tobacco Use  . Smoking status: Never Smoker  . Smokeless tobacco:  Never Used  Substance Use Topics  . Alcohol use: No    Alcohol/week: 0.0 standard drinks  . Drug use: No     Allergies   Shrimp [shellfish allergy]   Review of Systems Review of Systems  Respiratory: Positive for cough.   Genitourinary: Positive for vaginal bleeding.  All other systems reviewed and are negative.    Physical Exam Updated Vital Signs BP 139/80 (BP Location: Right Arm)   Pulse (!) 129   Temp 99.3 F (37.4 C) (Oral)   Resp 16   LMP 10/25/2018 (Exact Date)   SpO2 97%   Physical Exam Vitals signs and nursing note reviewed. Exam conducted with a chaperone present.  Constitutional:      General: She is not in acute distress.    Appearance: She is well-developed.  HENT:     Head: Normocephalic and atraumatic.  Eyes:     Conjunctiva/sclera: Conjunctivae normal.      Pupils: Pupils are equal, round, and reactive to light.  Neck:     Musculoskeletal: Normal range of motion and neck supple.  Cardiovascular:     Rate and Rhythm: Normal rate and regular rhythm.     Heart sounds: Normal heart sounds.  Pulmonary:     Effort: Pulmonary effort is normal. No respiratory distress.     Breath sounds: Normal breath sounds.  Abdominal:     General: There is no distension.     Palpations: Abdomen is soft.     Tenderness: There is no abdominal tenderness.  Genitourinary:    Vagina: Vaginal discharge present.     Comments: Pelvic exam performed with RN chaperone present.  Scant brownish discharge noted.  No active hemorrhage or bleeding.  Os is closed. Musculoskeletal: Normal range of motion.        General: No deformity.  Skin:    General: Skin is warm and dry.  Neurological:     Mental Status: She is alert and oriented to person, place, and time.      ED Treatments / Results  Labs (all labs ordered are listed, but only abnormal results are displayed) Labs Reviewed  WET PREP, GENITAL - Abnormal; Notable for the following components:      Result Value   Clue Cells Wet Prep HPF POC PRESENT (*)    WBC, Wet Prep HPF POC MANY (*)    All other components within normal limits  PREGNANCY, URINE - Abnormal; Notable for the following components:   Preg Test, Ur POSITIVE (*)    All other components within normal limits  URINALYSIS, ROUTINE W REFLEX MICROSCOPIC - Abnormal; Notable for the following components:   Hgb urine dipstick SMALL (*)    Bacteria, UA RARE (*)    All other components within normal limits  HCG, QUANTITATIVE, PREGNANCY - Abnormal; Notable for the following components:   hCG, Beta Chain, Quant, S 2,169 (*)    All other components within normal limits  I-STAT BETA HCG BLOOD, ED ([facility], [facility], [facility] ONLY) - Abnormal; Notable for the following components:   I-stat hCG, quantitative 1,775.9 (*)    All other components within normal limits  CBC WITH  DIFFERENTIAL/PLATELET  TYPE AND SCREEN  ABO/RH  GC/CHLAMYDIA PROBE AMP ([place]) NOT AT [facility]    EKG None  Radiology [place] Ob Less Than 14 Weeks With Ob Transvaginal  Result Date: 01/13/2019 CLINICAL DATA:  Vaginal bleeding in early pregnancy, quantitative beta HCG = 1776, EGA [redacted] weeks 3 days by LMP of 10/25/2018 EXAM: OBSTETRIC <  14 WK [place] AND TRANSVAGINAL OB US TECHNIQUE: Both transabdominal and transvaginal ultrasound examinations were performed for complete evaluation of the gestation as well as the maternal uterus, adnexal regions, and pelvic cul-de-sac. Transvaginal technique was performed to assess early pregnancy. COMPARISON:  None FINDINGS: Intrauterine gestational sac: Present, single Yolk sac:  Not visualized Embryo:  Present Cardiac Activity: Absent Heart Rate: N/A  bpm CRL:  13.3 mm   7 w   4 d                  [place] EDC: 08/28/2019 Subchorionic hemorrhage:  Small subchronic hemorrhage Maternal uterus/adnexae: Nabothian cysts at cervix. RIGHT ovary not visualized. LEFT ovary normal size and morphology 2.9 x 1.5 x 2.4 cm. No free pelvic fluid or adnexal masses. IMPRESSION: Single intrauterine gestation is seen within the uterus. However no fetal cardiac activity is identified. Findings meet definitive criteria for failed pregnancy. This follows SRU consensus guidelines: Diagnostic Criteria for Nonviable Pregnancy Early in the First Trimester. Macy Mis J Med [phone]. Electronically Signed   By: Ulyses Southward M.D.   On: 01/13/2019 13:49    Procedures Procedures (including critical care time)  Medications Ordered in ED Medications - No data to display   Initial Impression / Assessment and Plan / ED Course  I have reviewed the triage vital signs and the nursing notes.  Pertinent labs & imaging results that were available during my care of the patient were reviewed by me and considered in my medical decision making (see chart for details).     MDM  Screen complete  Patient is  presenting for evaluation of symptoms consistent with likely viral URI and concurrent vaginal spotting.  Patient is early in her reported pregnancy.  She has not yet had OB evaluation.  Screening labs obtained in the ED are without significant abnormality.  Patient is apparently pregnant.  Blood type is O+.  CBC is without evidence of significant hemorrhage.  Ultrasound does show intrauterine pregnancy.  There is no fetal heart rate detected.  Patient has a pending appointment with [facility]'s health at the [facility] on February 7.  Case discussed with OB Adrian Blackwater) - they request one time dose of Cytotec now. They will follow up with the patient for outpatient follow up.   Patient appears to be appropriate for discharge.  Strict return precautions given and understood.  Sports close follow-up was stressed.   Final Clinical Impressions(s) / ED Diagnoses   Final diagnoses:  Vaginal bleeding    ED Discharge Orders         Ordered    ondansetron (ZOFRAN) 4 MG tablet  Every 6 hours     01/13/19 1431    traMADol (ULTRAM) 50 MG tablet  Every 6 hours PRN     01/13/19 1431           Wynetta Fines, MD 01/13/19 1454    Wynetta Fines, MD 01/13/19 [phone]

## 2019-01-14 ENCOUNTER — Encounter: Payer: Self-pay | Admitting: Nurse Practitioner

## 2019-01-14 ENCOUNTER — Ambulatory Visit (INDEPENDENT_AMBULATORY_CARE_PROVIDER_SITE_OTHER): Payer: Self-pay | Admitting: Nurse Practitioner

## 2019-01-14 VITALS — BP 130/83 | HR 122 | Ht 63.0 in | Wt 219.9 lb

## 2019-01-14 DIAGNOSIS — O039 Complete or unspecified spontaneous abortion without complication: Secondary | ICD-10-CM

## 2019-01-14 HISTORY — DX: Complete or unspecified spontaneous abortion without complication: O03.9

## 2019-01-14 LAB — GC/CHLAMYDIA PROBE AMP (~~LOC~~) NOT AT ARMC
CHLAMYDIA, DNA PROBE: NEGATIVE
Neisseria Gonorrhea: NEGATIVE

## 2019-01-14 NOTE — Patient Instructions (Signed)
Aborto espontneo Miscarriage El aborto espontneo es la prdida de un beb que no ha nacido (feto) antes de la semana20 del embarazo. Siga estas indicaciones en su casa: Medicamentos   Tome los medicamentos de venta libre y los recetados solamente como se lo haya indicado el mdico.  Si le recetaron un antibitico, tmelo como se lo haya indicado el mdico. No deje de tomar los antibiticos aunque comience a sentirse mejor.  No tome antiinflamatorios no esteroideos (AINE), a menos que el mdico le diga que son seguros para usted. Estos incluyen aspirina e ibuprofeno. Estos medicamentos pueden provocarle sangrado. Actividad  Haga reposo segn lo indicado. Pregntele al mdico qu actividades son seguras para usted.  Pida ayuda para realizar las tareas de la casa durante este tiempo. Instrucciones generales  Anote cuntos apsitos usa por da y cun saturados estn.  Observe la cantidad de tejido o grumos de sangre (cogulos de sangre) que expulsa por la vagina. Guarde las cantidades grandes de tejido para llevrselas al mdico.  No use tampones, no se haga duchas vaginales ni tenga relaciones sexuales hasta que el mdico la autorice.  Para que usted y su pareja puedan sobrellevar el proceso de duelo, hable con su mdico o busque apoyo psicolgico.  Cuando est lista, acuda al mdico para hablar sobre los pasos que debe seguir para cuidar su salud. Adems, hable con su mdico sobre las medidas que debe adoptar para tener un embarazo saludable en el futuro.  Concurra a todas las visitas de seguimiento como se lo haya indicado el mdico. Esto es importante. Comunquese con un mdico si:  Tiene fiebre o siente escalofros.  Tiene una secrecin vaginal con mal olor.  Aumenta el sangrado. Solicite ayuda de inmediato si:  Tiene espasmos o dolor muy intensos en el abdomen o en la espalda.  Elimina grumos de sangre por la vagina, que tienen el tamao de una nuez o ms.  Elimina  tejido por la vagina, que tiene el tamao de una nuez o ms.  Empapa ms de un apsito de tamao normal por hora.  Se siente dbil o mareada.  Pierde el conocimiento (se desmaya).  Siente tristeza que no se va o piensa en lastimarse. Resumen  El aborto espontneo es la prdida de un beb que no ha nacido antes de la semana20 del embarazo.  Siga las indicaciones de su mdico para el cuidado en su hogar. Concurra a todas las visitas de control.  Para que usted y su pareja puedan sobrellevar el proceso de duelo, hable con su mdico o busque apoyo psicolgico. Esta informacin no tiene como fin reemplazar el consejo del mdico. Asegrese de hacerle al mdico cualquier pregunta que tenga. Document Released: 06/17/2012 Document Revised: 09/23/2017 Document Reviewed: 09/23/2017 Elsevier Interactive Patient Education  2019 Elsevier Inc.  

## 2019-01-14 NOTE — Progress Notes (Signed)
GYNECOLOGY OFFICE VISIT NOTE   History:  43 y.o. [id number] here today for follow up from ER visit on yesterday where she was diagnosed with a nonviable pregnancy with baby at 6328w4d. She was prescribed cytotec and then reports she had heaving bleeding and cramping last night and this morning.  She is currently bleeding much less.  She denies any abnormal vaginal discharge, bleeding, pelvic pain or other concerns.   She has had 4 previous miscarriages but does not want to have any more children.  Interpreter present for all of her visit.  No past medical history on file.  Past Surgical History:  Procedure Laterality Date  . CESAREAN SECTION    . CHOLECYSTECTOMY      The following portions of the patient's history were reviewed and updated as appropriate: allergies, current medications, past family history, past medical history, past social history, past surgical history and problem list.   Health Maintenance:  No pap or mammogram on file.  Review of Systems:  Pertinent items noted in HPI and remainder of comprehensive ROS otherwise negative.  Objective:  Physical Exam BP 130/83   Pulse (!) 122   Ht 5\' 3"  (1.6 m)   Wt 219 lb 14.4 oz (99.7 kg)   LMP 10/25/2018 (Exact Date) Comment: Missed AB  Breastfeeding Unknown   BMI 38.95 kg/m  CONSTITUTIONAL: Well-developed, well-nourished female in no acute distress.  HENT:  Normocephalic, atraumatic.  EYES: Conjunctivae and EOM are normal. Pupils are equal, round.  No scleral icterus.  NECK: Normal range of motion, supple,  SKIN: Skin is warm and dry. No rash noted. Not diaphoretic. No erythema. No pallor. NEUROLOGIC: Alert and oriented to person, place, and time. Normal muscle tone coordination. No cranial nerve deficit noted. PSYCHIATRIC: Normal mood and affect. Normal behavior. Normal judgment and thought content. CARDIOVASCULAR: Normal heart rate noted RESPIRATORY: Effort and breath sounds normal, no problems with respiration  noted MUSCULOSKELETAL: Normal range of motion. No edema noted.  Labs and Imaging [place] Ob Less Than 14 Weeks With Ob Transvaginal  Result Date: 01/13/2019 CLINICAL DATA:  Vaginal bleeding in early pregnancy, quantitative beta HCG = 1776, EGA [redacted] weeks 3 days by LMP of 10/25/2018 EXAM: OBSTETRIC <14 WK US AND TRANSVAGINAL OB US TECHNIQUE: Both transabdominal and transvaginal ultrasound examinations were performed for complete evaluation of the gestation as well as the maternal uterus, adnexal regions, and pelvic cul-de-sac. Transvaginal technique was performed to assess early pregnancy. COMPARISON:  None FINDINGS: Intrauterine gestational sac: Present, single Yolk sac:  Not visualized Embryo:  Present Cardiac Activity: Absent Heart Rate: N/A  bpm CRL:  13.3 mm   7 w   4 d                  US EDC: 08/28/2019 Subchorionic hemorrhage:  Small subchronic hemorrhage Maternal uterus/adnexae: Nabothian cysts at cervix. RIGHT ovary not visualized. LEFT ovary normal size and morphology 2.9 x 1.5 x 2.4 cm. No free pelvic fluid or adnexal masses. IMPRESSION: Single intrauterine gestation is seen within the uterus. However no fetal cardiac activity is identified. Findings meet definitive criteria for failed pregnancy. This follows SRU consensus guidelines: Diagnostic Criteria for Nonviable Pregnancy Early in the First Trimester. Macy Mis Engl J Med [phone]. Electronically Signed   By: Ulyses SouthwardMark  Boles M.D.   On: 01/13/2019 13:49    Assessment & Plan:  1. Complete miscarriage Consult with Dr. Shawnie PonsPratt - will come for 2 quants and then see a provider. Advised no intercourse until quants have gone  to zero. Will plan to use condoms for contraception for now when able to resume intercourse.  Routine preventative health maintenance measures emphasized. Please refer to After Visit Summary for other counseling recommendations.   Return for one week - labs only, two weeks lab only and then 2 days later for provider visit  .   Total face-to-face time with patient: 20 minutes.  Over 50% of encounter was spent on counseling and coordination of care.  Nolene BernheimERRI , RN, MSN, NP-BC Nurse Practitioner, [facility], [facility] 01/14/2019 3:26 PM

## 2019-01-21 ENCOUNTER — Other Ambulatory Visit: Payer: Self-pay

## 2019-01-21 DIAGNOSIS — O039 Complete or unspecified spontaneous abortion without complication: Secondary | ICD-10-CM

## 2019-01-22 LAB — BETA HCG QUANT (REF LAB): hCG Quant: 86 m[IU]/mL

## 2019-01-26 ENCOUNTER — Other Ambulatory Visit: Payer: Self-pay | Admitting: *Deleted

## 2019-01-26 DIAGNOSIS — O039 Complete or unspecified spontaneous abortion without complication: Secondary | ICD-10-CM

## 2019-01-28 ENCOUNTER — Other Ambulatory Visit: Payer: Self-pay

## 2019-01-28 DIAGNOSIS — O039 Complete or unspecified spontaneous abortion without complication: Secondary | ICD-10-CM

## 2019-01-29 LAB — BETA HCG QUANT (REF LAB): hCG Quant: 14 m[IU]/mL

## 2019-01-30 ENCOUNTER — Ambulatory Visit (INDEPENDENT_AMBULATORY_CARE_PROVIDER_SITE_OTHER): Payer: Self-pay | Admitting: Family Medicine

## 2019-01-30 VITALS — BP 111/64 | HR 64 | Wt 225.2 lb

## 2019-01-30 DIAGNOSIS — Z3009 Encounter for other general counseling and advice on contraception: Secondary | ICD-10-CM

## 2019-01-30 DIAGNOSIS — O039 Complete or unspecified spontaneous abortion without complication: Secondary | ICD-10-CM

## 2019-01-30 DIAGNOSIS — R1032 Left lower quadrant pain: Secondary | ICD-10-CM

## 2019-01-30 DIAGNOSIS — Z789 Other specified health status: Secondary | ICD-10-CM

## 2019-01-30 NOTE — Progress Notes (Signed)
*In-person interpreter used for visit* GYNECOLOGY OFFICE VISIT NOTE History:  43 y.o. [id number] here today for follow-up of first trimester loss.   - pain in abdomen and down leg, pain comes/goes LLQ - nor burning with urination, no constipation/diarrhea  - leg pain is really in left groin, point tenderness - spotting brown, no pad in last few days, feels like bleeding is slowing down much more - not sure what kind of birth control she wants to use - considering IUD, BTL, pills   The following portions of the patient's history were reviewed and updated as appropriate: allergies, current medications, past family history, past medical history, past social history, past surgical history and problem list.    Review of Systems:  Pertinent items noted in HPI Review of Systems  Constitutional: Negative for fever and malaise/fatigue.  Respiratory: Negative for shortness of breath.   Cardiovascular: Negative for chest pain.  Gastrointestinal: Positive for abdominal pain. Negative for diarrhea, nausea and vomiting.  Genitourinary: Negative for dysuria and flank pain.  Musculoskeletal: Negative for back pain and myalgias.  Neurological: Negative for dizziness and headaches.  Psychiatric/Behavioral: Negative for depression. The patient is not nervous/anxious.    Objective:  Physical Exam BP 111/64   Pulse 64   Wt 225 lb 3.2 oz (102.2 kg)   BMI 39.89 kg/m  Physical Exam  Constitutional: She is oriented to person, place, and time. She appears well-developed and well-nourished. No distress.  HENT:  Head: Normocephalic and atraumatic.  Eyes: Conjunctivae and EOM are normal.  Cardiovascular: Normal rate.  Pulmonary/Chest: No respiratory distress.  Abdominal: Soft. There is no guarding.  Tenderness to palpation over left inguinal area without any lymphadenopathy  Musculoskeletal:        General: No edema.  Neurological: She is alert and oriented to person, place, and time.  Psychiatric:  She has a normal mood and affect. Her behavior is normal.  Nursing note and vitals reviewed.  Labs and Imaging Results for orders placed or performed in visit on 01/28/19 (from the past 168 hour(s))  Beta hCG quant (ref lab)   Collection Time: 01/28/19 11:05 AM  Result Value Ref Range   hCG Quant 14 mIU/mL   US Ob Less Than 14 Weeks With Ob Transvaginal  Result Date: 01/13/2019 CLINICAL DATA:  Vaginal bleeding in early pregnancy, quantitative beta HCG = 1776, EGA [redacted] weeks 3 days by LMP of 10/25/2018 EXAM: OBSTETRIC <14 WK [place] AND TRANSVAGINAL OB US TECHNIQUE: Both transabdominal and transvaginal ultrasound examinations were performed for complete evaluation of the gestation as well as the maternal uterus, adnexal regions, and pelvic cul-de-sac. Transvaginal technique was performed to assess early pregnancy. COMPARISON:  None FINDINGS: Intrauterine gestational sac: Present, single Yolk sac:  Not visualized Embryo:  Present Cardiac Activity: Absent Heart Rate: N/A  bpm CRL:  13.3 mm   7 w   4 d                  [place] EDC: 08/28/2019 Subchorionic hemorrhage:  Small subchronic hemorrhage Maternal uterus/adnexae: Nabothian cysts at cervix. RIGHT ovary not visualized. LEFT ovary normal size and morphology 2.9 x 1.5 x 2.4 cm. No free pelvic fluid or adnexal masses. IMPRESSION: Single intrauterine gestation is seen within the uterus. However no fetal cardiac activity is identified. Findings meet definitive criteria for failed pregnancy. This follows SRU consensus guidelines: Diagnostic Criteria for Nonviable Pregnancy Early in the First Trimester. [facility] [phone]. Electronically Signed   By: Angelyn Punt.D.  On: 01/13/2019 13:49    Assessment & Plan:  42yo [id number] who is s/p missed abortion and cytotec. Bleeding is now minimal. BhCG checked two days ago and was continuing to down-trend (14). She will return next week for an additional check. Most of visit today spent in counseling regarding birth  control preferences. Patient desires IUD and will go to health department to have it placed.   Groin pain likely muscular in etiology. Advised warm compress, ibuprofen and Tylenol. Reviewed return precautions.   Cristal Deer. Earlene Plater, DO OB Family Medicine Fellow, [facility] for [employer], [facility]

## 2019-02-03 ENCOUNTER — Other Ambulatory Visit: Payer: Self-pay

## 2019-02-03 DIAGNOSIS — O039 Complete or unspecified spontaneous abortion without complication: Secondary | ICD-10-CM

## 2019-02-04 LAB — BETA HCG QUANT (REF LAB): hCG Quant: 2 m[IU]/mL

## 2019-02-24 ENCOUNTER — Telehealth: Payer: Self-pay | Admitting: Obstetrics and Gynecology

## 2019-02-24 ENCOUNTER — Encounter: Payer: Self-pay | Admitting: Student

## 2019-02-24 NOTE — Telephone Encounter (Signed)
Called the patient to reschedule missed appointment. The patient stated she had a miscarriage. Attempted to make a follow up appointment however she stated she visited the hospital. Also stated she would like to complete the follow up at the health department when she receives the IUD.

## 2020-10-24 ENCOUNTER — Telehealth: Payer: Self-pay | Admitting: Family Medicine

## 2020-10-24 NOTE — Telephone Encounter (Signed)
Patient is calling to schedule appt with Mikle Bosworth to schedule CAFA appt. Please advise CB- [phone]

## 2020-10-25 NOTE — Telephone Encounter (Signed)
I call back the PT, unable to LVM, Pt see the see the PCP first before she can schedule a financial appt

## 2020-11-06 NOTE — Progress Notes (Signed)
Subjective:    Patient ID: Sierra Novak, female    DOB: 1976-05-03, 44 y.o.   MRN: [id number]  44 y.o.F not seen here since 03/2017, here to re establish PCP. This patient's had 2 previous C-sections one in which her baby did not live the second when she had twins they are both alive and well since the C-section she has had some abdominal pain around the incision site it was an emergency C-section.  The abdominal pain is subumbilical.  There was a lump there that appeared 7 months ago seems to be less at this time.  Note she has history of vitamin D deficiency but she has been off supplements for 3 years.  She is due up a flu vaccine and tetanus vaccine and wishes to receive these.  Her depression screen on arrival today is PHQ-9 of 5.  She needs a hepatitis C screen.  She is also due up a Pap smear.  She has no other complaints at this time.  She states her thyroid condition was diagnosed 3 years ago with a blood test.  She has had no imaging studies and she is on 125 mcg of levothyroxine daily.  Only allergies are shellfish.  She has no other complaints at this time.  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  She does not work outside the home.  She is up to date on her Covid vaccines both occurring in April she cannot member whether it has been Architectural technologist       Past Medical History:  Diagnosis Date  . Complete miscarriage 01/14/2019  . Thyroid disease      Family History  Problem Relation Age of Onset  . Hypertension Father   . Diabetes Paternal Aunt      Social History   Socioeconomic History  . Marital status: Legally Separated    Spouse name: Not on file  . Number of children: Not on file  . Years of education: Not on file  . Highest education level: Not on file  Occupational History  . Not on file  Tobacco Use  . Smoking status: Never Smoker  . Smokeless tobacco: Never Used  Substance and Sexual  Activity  . Alcohol use: No    Alcohol/week: 0.0 standard drinks  . Drug use: No  . Sexual activity: Yes    Comment: 1ST INTERCOURSE- 51, PARTNERS- 1  Other Topics Concern  . Not on file  Social History Narrative   ** Merged History Encounter **       Social Determinants of Health   Financial Resource Strain:   . Difficulty of Paying Living Expenses: Not on file  Food Insecurity:   . Worried About Programme researcher, broadcasting/film/video in the Last Year: Not on file  . Ran Out of Food in the Last Year: Not on file  Transportation Needs:   . Lack of Transportation (Medical): Not on file  . Lack of Transportation (Non-Medical): Not on file  Physical Activity:   . Days of Exercise per Week: Not on file  . Minutes of Exercise per Session: Not on file  Stress:   . Feeling of Stress : Not on file  Social Connections:   . Frequency of Communication with Friends and Family: Not on file  . Frequency of Social Gatherings with Friends and Family: Not on file  . Attends Religious Services: Not on file  . Active Member of Clubs or  Organizations: Not on file  . Attends Banker Meetings: Not on file  . Marital Status: Not on file  Intimate Partner Violence:   . Fear of Current or Ex-Partner: Not on file  . Emotionally Abused: Not on file  . Physically Abused: Not on file  . Sexually Abused: Not on file     Allergies  Allergen Reactions  . Shrimp [Shellfish Allergy]      Outpatient Medications Prior to Visit  Medication Sig Dispense Refill  . ibuprofen (ADVIL,MOTRIN) 600 MG tablet Take 600 mg by mouth every 6 (six) hours as needed.    Marland Kitchen levothyroxine (SYNTHROID) 125 MCG tablet Take 125 mcg by mouth daily.    . ondansetron (ZOFRAN) 4 MG tablet Take 1 tablet (4 mg total) by mouth every 6 (six) hours. (Patient not taking: Reported on 11/07/2020) 12 tablet 0   No facility-administered medications prior to visit.      Review of Systems  Constitutional: Negative.   HENT: Negative.    Eyes: Negative.   Respiratory: Negative.   Cardiovascular: Negative.   Gastrointestinal: Positive for abdominal pain. Negative for abdominal distention, blood in stool, diarrhea and nausea.  Endocrine: Negative.   Genitourinary: Negative.   Musculoskeletal: Negative.   Skin: Negative.   Allergic/Immunologic: Negative.   Hematological: Negative.   Psychiatric/Behavioral: Negative.        Objective:   Physical Exam Vitals:   11/07/20 0937  BP: 128/83  Pulse: 62  Resp: 18  Temp: (!) 96.4 F (35.8 C)  TempSrc: Temporal  SpO2: 97%  Weight: 226 lb (102.5 kg)  Height: 5\' 4"  (1.626 m)    Gen: Pleasant, well-nourished, in no distress,  normal affect  ENT: No lesions,  mouth clear,  oropharynx clear, no postnasal drip  Neck: No JVD, no TMG, no carotid bruits  Lungs: No use of accessory muscles, no dullness to percussion, clear without rales or rhonchi  Cardiovascular: RRR, heart sounds normal, no murmur or gallops, no peripheral edema  Abdomen: soft and NT, no HSM,  BS normal, there appears to be a granuloma formation in the lateral aspect of the C-section incision there is no tenderness in this area there are no other masses.  There is no evidence of hernia formation in the incision  Musculoskeletal: No deformities, no cyanosis or clubbing  Neuro: alert, non focal  Skin: Warm, no lesions or rashes       Assessment & Plan:  I personally reviewed all images and lab data in the [facility] system as well as any outside material available during this office visit and agree with the  radiology impressions.   Acquired hypothyroidism Acquired hypothyroidism diagnosed 3 years ago will check thyroid function and continue Synthroid at 125 mcg daily  Imaging of the thyroid not necessary as thyroid exam was normal at this visit  Vitamin D deficiency History of vitamin D deficiency will check vitamin D levels at this visit   Sierra Novak was seen today for establish care.  Diagnoses  and all orders for this visit:  Acquired hypothyroidism -     Thyroid Panel With TSH  Need for hepatitis C screening test -     HCV Ab w/Rflx to Verification  Vitamin D deficiency -     VITAMIN D 25 Hydroxy (Vit-D Deficiency, Fractures)  Other orders -     levothyroxine (SYNTHROID) 125 MCG tablet; Take 1 tablet (125 mcg total) by mouth daily.   Flu vaccine and Tdap were given at this visit will  have a Pap smear to be scheduled in financial assistance to be achieved hepatitis C level to be checked

## 2020-11-07 ENCOUNTER — Ambulatory Visit: Payer: Self-pay | Attending: Critical Care Medicine | Admitting: Critical Care Medicine

## 2020-11-07 ENCOUNTER — Encounter: Payer: Self-pay | Admitting: Critical Care Medicine

## 2020-11-07 ENCOUNTER — Other Ambulatory Visit: Payer: Self-pay | Admitting: Critical Care Medicine

## 2020-11-07 ENCOUNTER — Other Ambulatory Visit: Payer: Self-pay

## 2020-11-07 VITALS — BP 128/83 | HR 62 | Temp 96.4°F | Resp 18 | Ht 64.0 in | Wt 226.0 lb

## 2020-11-07 DIAGNOSIS — E039 Hypothyroidism, unspecified: Secondary | ICD-10-CM

## 2020-11-07 DIAGNOSIS — Z1159 Encounter for screening for other viral diseases: Secondary | ICD-10-CM

## 2020-11-07 DIAGNOSIS — Z23 Encounter for immunization: Secondary | ICD-10-CM

## 2020-11-07 DIAGNOSIS — E559 Vitamin D deficiency, unspecified: Secondary | ICD-10-CM

## 2020-11-07 MED ORDER — LEVOTHYROXINE SODIUM 125 MCG PO TABS: 125.0000 ug | ORAL_TABLET | Freq: Every day | ORAL | 3 refills | Status: DC

## 2020-11-07 MED FILL — LEVOTHYROXINE 125 MCG TAB: 125 | 30 days supply | Qty: 30 | Fill #0

## 2020-11-07 NOTE — Patient Instructions (Signed)
Continue thyroid medication daily refill sent to our pharmacy  A flu shot and tetanus vaccine was given  Labs today include hepatitis C vitamin D and thyroid levels  An application for financial assistance will be given  Return for a Pap smear with one of the other doctors in this clinic  Return to see Dr. Delford Field 4 months

## 2020-11-07 NOTE — Progress Notes (Signed)
Reports abdominal pain x 3 months, next to belly button, felt lump there x 7 yrs ago, worse w/ movement/activity   Wants flu/Tdap vaccines today

## 2020-11-07 NOTE — Assessment & Plan Note (Signed)
Acquired hypothyroidism diagnosed 3 years ago will check thyroid function and continue Synthroid at 125 mcg daily  Imaging of the thyroid not necessary as thyroid exam was normal at this visit

## 2020-11-07 NOTE — Assessment & Plan Note (Signed)
History of vitamin D deficiency will check vitamin D levels at this visit

## 2020-11-08 ENCOUNTER — Other Ambulatory Visit: Payer: Self-pay | Admitting: Critical Care Medicine

## 2020-11-08 LAB — HCV AB W/RFLX TO VERIFICATION: HCV Ab: <0.1 s/co ratio (ref 0.0–0.9)

## 2020-11-08 LAB — THYROID PANEL WITH TSH
Free Thyroxine Index: 2.6 (ref 1.2–4.9)
T3 Uptake Ratio: 26 % (ref 24–39)
T4, Total: 10.1 ug/dL (ref 4.5–12.0)
TSH: 1.66 u[IU]/mL (ref 0.450–4.500)

## 2020-11-08 LAB — HCV INTERPRETATION

## 2020-11-08 LAB — VITAMIN D 25 HYDROXY (VIT D DEFICIENCY, FRACTURES): Vit D, 25-Hydroxy: 17.6 ng/mL — ABNORMAL LOW (ref 30.0–100.0)

## 2020-11-08 MED ORDER — VITAMIN D (ERGOCALCIFEROL) 1.25 MG (50000 UNIT) PO CAPS: 50000.0000 [IU] | ORAL_CAPSULE | ORAL | 2 refills | Status: DC

## 2020-11-08 MED FILL — VIT D2 1.25 MG (50,000 UNIT: 1.25 MG | 84 days supply | Qty: 12 | Fill #0

## 2020-11-18 ENCOUNTER — Ambulatory Visit: Payer: Self-pay | Attending: Critical Care Medicine

## 2020-11-18 ENCOUNTER — Other Ambulatory Visit: Payer: Self-pay

## 2020-12-08 ENCOUNTER — Other Ambulatory Visit: Payer: Self-pay

## 2020-12-08 ENCOUNTER — Encounter: Payer: Self-pay | Admitting: Internal Medicine

## 2020-12-08 ENCOUNTER — Ambulatory Visit: Payer: Self-pay | Attending: Internal Medicine | Admitting: Internal Medicine

## 2020-12-08 VITALS — BP 125/88 | HR 68 | Temp 97.5°F | Resp 16 | Wt 226.5 lb

## 2020-12-08 DIAGNOSIS — K0889 Other specified disorders of teeth and supporting structures: Secondary | ICD-10-CM

## 2020-12-08 DIAGNOSIS — Z124 Encounter for screening for malignant neoplasm of cervix: Secondary | ICD-10-CM

## 2020-12-08 MED FILL — LEVOTHYROXINE 125 MCG TAB: 125 | 30 days supply | Qty: 30 | Fill #1

## 2020-12-08 NOTE — Progress Notes (Signed)
Patient ID: Sierra Novak, female    DOB: 06/28/1976  MRN: [id number]  CC: Gynecologic Exam   Subjective: Sierra Novak is a 44 y.o. female who presents for PAP.  PCP is Dr. Delford Field Her concerns today include:  Pt with hx of hypothyroid, Vit D def  Pt is G2P6 No abn paps in past. Last pap in 2017 Menses regular lasting 3-4 days.  Moderate bleeding Not on any method of BC Sexually active with 1 partner No vaginal itching or dischg.  Agreeable to STI screening No fhx of breast of breast, uterine or ovarian  Patient requesting referral to dentist for dental pain in the left upper jaw.  She denies any swelling.  Not sure if she has decayed tooth.  Patient Active Problem List   Diagnosis Date Noted  . Acquired hypothyroidism 11/07/2020  . Vitamin D deficiency 09/28/2014     Current Outpatient Medications on File Prior to Visit  Medication Sig Dispense Refill  . ibuprofen (ADVIL,MOTRIN) 600 MG tablet Take 600 mg by mouth every 6 (six) hours as needed.    Marland Kitchen levothyroxine (SYNTHROID) 125 MCG tablet Take 1 tablet (125 mcg total) by mouth daily. 30 tablet 3  . Vitamin D, Ergocalciferol, (DRISDOL) 1.25 MG (50000 UNIT) CAPS capsule Take 1 capsule (50,000 Units total) by mouth every 7 (seven) days. 12 capsule 2   No current facility-administered medications on file prior to visit.    Allergies  Allergen Reactions  . Shrimp [Shellfish Allergy]     Social History   Socioeconomic History  . Marital status: Legally Separated    Spouse name: Not on file  . Number of children: Not on file  . Years of education: Not on file  . Highest education level: Not on file  Occupational History  . Not on file  Tobacco Use  . Smoking status: Never Smoker  . Smokeless tobacco: Never Used  Substance and Sexual Activity  . Alcohol use: No    Alcohol/week: 0.0 standard drinks  . Drug use: No  . Sexual activity: Yes    Comment: 1ST INTERCOURSE- 41, PARTNERS- 1  Other  Topics Concern  . Not on file  Social History Narrative   ** Merged History Encounter **       Social Determinants of Health   Financial Resource Strain: Not on file  Food Insecurity: Not on file  Transportation Needs: Not on file  Physical Activity: Not on file  Stress: Not on file  Social Connections: Not on file  Intimate Partner Violence: Not on file    Family History  Problem Relation Age of Onset  . Hypertension Father   . Diabetes Paternal Aunt     Past Surgical History:  Procedure Laterality Date  . CESAREAN SECTION    . CHOLECYSTECTOMY      ROS: Review of Systems Negative except as stated above  PHYSICAL EXAM: BP 125/88   Pulse 68   Temp (!) 97.5 F (36.4 C)   Resp 16   Wt 226 lb 8 oz (102.7 kg)   SpO2 97%   BMI 38.88 kg/m   Physical Exam  General appearance - alert, well appearing, and in no distress Mental status - normal mood, behavior, speech, dress, motor activity, and thought processes Breasts -CMA  Romeo Apple Y present for breast and pelvic exam: Breasts appear normal, no suspicious masses, no skin or nipple changes or axillary nodes Pelvic - normal external genitalia, vulva, vagina, cervix, uterus and adnexa Mouth:  No obvious cavities seen.  No inflammation of the gums in the left upper jaw noted.  CMP Latest Ref Rng & Units 04/17/2017 07/23/2016 04/07/2015  Glucose 65 - 99 mg/dL 97 90 86  BUN 6 - 24 mg/dL 8 9 9   Creatinine 0.57 - 1.00 mg/dL ) 6.81(E 7.51  Sodium 134 - 144 mmol/L 140 138 138  Potassium 3.5 - 5.2 mmol/L 4.3 4.3 4.7  Chloride 96 - 106 mmol/L 105 104 107  CO2 18 - 29 mmol/L 24 25 23   Calcium 8.7 - 10.2 mg/dL 9.3 9.3 9.1  Total Protein 6.0 - 8.5 g/dL 6.8 - 6.9  Total Bilirubin 0.0 - 1.2 mg/dL 0.5 - 0.8  Alkaline Phos 39 - 117 IU/L 56 - 45  AST 0 - 40 IU/L 17 - 18  ALT 0 - 32 IU/L 21 - 16   Lipid Panel     Component Value Date/Time   CHOL 187 04/17/2017 1020   TRIG 80 04/17/2017 1020   HDL 46 04/17/2017 1020   CHOLHDL 4.1  04/17/2017 1020   CHOLHDL 4.0 03/22/2014 1121   VLDL 23 03/22/2014 1121   LDLCALC 125 (H) 04/17/2017 1020    CBC    Component Value Date/Time   WBC 6.4 01/13/2019 1224   RBC 4.16 01/13/2019 1224   HGB 13.1 01/13/2019 1224   HCT 39.2 01/13/2019 1224   PLT 236 01/13/2019 1224   MCV 94.2 01/13/2019 1224   MCH 31.5 01/13/2019 1224   MCHC 33.4 01/13/2019 1224   RDW 13.5 01/13/2019 1224   LYMPHSABS 1.1 01/13/2019 1224   MONOABS 0.7 01/13/2019 1224   EOSABS 0.0 01/13/2019 1224   BASOSABS 0.0 01/13/2019 1224    ASSESSMENT AND PLAN: 1. Pap smear for cervical cancer screening  - Cervicovaginal ancillary only - Cytology - PAP  2. Pain, dental - Ambulatory referral to Dentistry   Patient was given the opportunity to ask questions.  Patient verbalized understanding of the plan and was able to repeat key elements of the plan.  [employer] interpreter used during this encounter. 01/15/2019   No orders of the defined types were placed in this encounter.    Requested Prescriptions    No prescriptions requested or ordered in this encounter    No follow-ups on file.  01/15/2019, MD, FACP

## 2020-12-09 LAB — CERVICOVAGINAL ANCILLARY ONLY
Bacterial Vaginitis (gardnerella): NEGATIVE
Candida Glabrata: NEGATIVE
Candida Vaginitis: NEGATIVE
Chlamydia: NEGATIVE
Comment: NEGATIVE
Comment: NEGATIVE
Comment: NEGATIVE
Comment: NEGATIVE
Comment: NEGATIVE
Comment: NORMAL
Neisseria Gonorrhea: NEGATIVE
Trichomonas: NEGATIVE

## 2020-12-09 LAB — CYTOLOGY - PAP
Comment: NEGATIVE
Diagnosis: NEGATIVE
High risk HPV: NEGATIVE

## 2020-12-16 MED FILL — LEVOTHYROXINE 125 MCG TAB: 125 | 30 days supply | Qty: 30 | Fill #1

## 2021-03-03 MED FILL — LEVOTHYROXINE 125 MCG TAB: 125 | 30 days supply | Qty: 30 | Fill #2

## 2021-03-07 ENCOUNTER — Encounter: Payer: Self-pay | Admitting: Critical Care Medicine

## 2021-03-07 ENCOUNTER — Ambulatory Visit: Payer: Self-pay | Attending: Critical Care Medicine | Admitting: Critical Care Medicine

## 2021-03-07 ENCOUNTER — Other Ambulatory Visit: Payer: Self-pay | Admitting: Critical Care Medicine

## 2021-03-07 ENCOUNTER — Other Ambulatory Visit: Payer: Self-pay

## 2021-03-07 DIAGNOSIS — K089 Disorder of teeth and supporting structures, unspecified: Secondary | ICD-10-CM

## 2021-03-07 DIAGNOSIS — E039 Hypothyroidism, unspecified: Secondary | ICD-10-CM

## 2021-03-07 DIAGNOSIS — G8929 Other chronic pain: Secondary | ICD-10-CM | POA: Insufficient documentation

## 2021-03-07 DIAGNOSIS — E559 Vitamin D deficiency, unspecified: Secondary | ICD-10-CM

## 2021-03-07 MED ORDER — VITAMIN D (ERGOCALCIFEROL) 1.25 MG (50000 UNIT) PO CAPS: 50000.0000 [IU] | ORAL_CAPSULE | ORAL | 2 refills | Status: DC

## 2021-03-07 MED ORDER — LEVOTHYROXINE SODIUM 125 MCG PO TABS: 125.0000 ug | ORAL_TABLET | Freq: Every day | ORAL | 3 refills | Status: DC

## 2021-03-07 MED FILL — VIT D2 1.25 MG (50,000 UNIT: 1.25 MG | 28 days supply | Qty: 4 | Fill #0

## 2021-03-07 NOTE — Progress Notes (Addendum)
Subjective:    Patient ID: Sierra Novak, female    DOB: [birth year], 45 y.o.   MRN: [id number] Virtual Visit via Telephone Note  I connected with Sierra Novak on 03/07/21 at  3:00 PM EST by telephone and verified that I am speaking with the correct person using two identifiers.   Consent:  I discussed the limitations, risks, security and privacy concerns of performing an evaluation and management service by telephone and the availability of in person appointments. I also discussed with the patient that there may be a patient responsible charge related to this service. The patient expressed understanding and agreed to proceed.  Location of patient: pt at home  Location of provider:i am in my office  Persons participating in the televisit with the patient.    Spanish interpreter on the call   History of Present Illness:  45 y.o.F not seen here since 03/2017, here to re establish PCP. 10/2020 This patient's had 2 previous C-sections one in which her baby did not live the second when she had twins they are both alive and well since the C-section she has had some abdominal pain around the incision site it was an emergency C-section.  The abdominal pain is subumbilical.  There was a lump there that appeared 7 months ago seems to be less at this time.  Note she has history of vitamin D deficiency but she has been off supplements for 3 years.  She is due up a flu vaccine and tetanus vaccine and wishes to receive these.  Her depression screen on arrival today is PHQ-9 of 5.  She needs a hepatitis C screen.  She is also due up a Pap smear.  She has no other complaints at this time.  She states her thyroid condition was diagnosed 3 years ago with a blood test.  She has had no imaging studies and she is on 125 mcg of levothyroxine daily.  Only allergies are shellfish.  She has no other complaints at this time.  She does occasionally have some swelling in the legs from varicose veins  from prior pregnancies.  She does not work outside the home.  She is up to date on her Covid vaccines both occurring in April she cannot member whether it has been [place] or [employer]  03/07/2021 The patient is seen by way of a telephone visit and is overall doing well her only complaints is pain in the right upper jaw from the front to the back teeth.  She had a referral sent in December but has not gone through.  She now has the orange card.  Note the patient is already had a Pap smear and was negative.  She has no other complaints at this visit.  Past Medical History:  Diagnosis Date  . Complete miscarriage 01/14/2019  . Thyroid disease      Family History  Problem Relation Age of Onset  . Hypertension Father   . Diabetes Paternal Aunt      Social History   Socioeconomic History  . Marital status: Legally Separated    Spouse name: Not on file  . Number of children: Not on file  . Years of education: Not on file  . Highest education level: Not on file  Occupational History  . Not on file  Tobacco Use  . Smoking status: Never Smoker  . Smokeless tobacco: Never Used  Substance and Sexual Activity  . Alcohol use: No    Alcohol/week: 0.0 standard  drinks  . Drug use: No  . Sexual activity: Yes    Comment: 1ST INTERCOURSE- 39, PARTNERS- 1  Other Topics Concern  . Not on file  Social History Narrative   ** Merged History Encounter **       Social Determinants of Health   Financial Resource Strain: Not on file  Food Insecurity: Not on file  Transportation Needs: Not on file  Physical Activity: Not on file  Stress: Not on file  Social Connections: Not on file  Intimate Partner Violence: Not on file     Allergies  Allergen Reactions  . Shrimp [Shellfish Allergy]      Outpatient Medications Prior to Visit  Medication Sig Dispense Refill  . levothyroxine (SYNTHROID) 125 MCG tablet Take 1 tablet (125 mcg total) by mouth daily. 30 tablet 3  . Vitamin D, Ergocalciferol,  (DRISDOL) 1.25 MG (50000 UNIT) CAPS capsule Take 1 capsule (50,000 Units total) by mouth every 7 (seven) days. 12 capsule 2  . ibuprofen (ADVIL,MOTRIN) 600 MG tablet Take 600 mg by mouth every 6 (six) hours as needed. (Patient not taking: Reported on 03/07/2021)     No facility-administered medications prior to visit.      Review of Systems  Constitutional: Negative.   HENT: Positive for dental problem.   Eyes: Negative.   Respiratory: Negative.   Cardiovascular: Negative.   Gastrointestinal: Negative for abdominal distention, abdominal pain, blood in stool, diarrhea and nausea.  Endocrine: Negative.   Genitourinary: Negative.   Musculoskeletal: Negative.   Skin: Negative.   Allergic/Immunologic: Negative.   Hematological: Negative.   Psychiatric/Behavioral: Negative.        Objective:   Physical Exam There were no vitals filed for this visit.  No exam this is a phone visit      Assessment & Plan:  I personally reviewed all images and lab data in the [facility] system as well as any outside material available during this office visit and agree with the  radiology impressions.   Acquired hypothyroidism Hypothyroidism stable at this time  Continue Synthroid as prescribed  Thyroid function last visit were normal  Vitamin D deficiency Vitamin D deficiency now on vitamin D supplementation  Refills on vitamin D made  Chronic dental pain Chronic dental pain unclear etiology Patient does have the orange card  Referral to [facility] adult dental made   Gabriel was seen today for follow-up.  Diagnoses and all orders for this visit:  Acquired hypothyroidism  Chronic dental pain -     Ambulatory referral to Dentistry  Vitamin D deficiency  Other orders -     Vitamin D, Ergocalciferol, (DRISDOL) 1.25 MG (50000 UNIT) CAPS capsule; Take 1 capsule (50,000 Units total) by mouth every 7 (seven) days. (Patient not taking: Reported on 03/07/2021) -     levothyroxine (SYNTHROID) 125  MCG tablet; Take 1 tablet (125 mcg total) by mouth daily.    Follow Up Instructions: Patient knows a face-to-face exam will be scheduled in May of this year  Patient needs a COVID booster she was given a number to call to schedule a Covid booster for [employer] I discussed the assessment and treatment plan with the patient. The patient was provided an opportunity to ask questions and all were answered. The patient agreed with the plan and demonstrated an understanding of the instructions.   The patient was advised to call back or seek an in-person evaluation if the symptoms worsen or if the condition fails to improve as anticipated.  I provided  30 minutes of non-face-to-face time during this encounter  including  median intraservice time , review of notes, labs, imaging, medications  and explaining diagnosis and management to the patient .    Shan Levans, MD

## 2021-03-07 NOTE — Assessment & Plan Note (Signed)
Chronic dental pain unclear etiology Patient does have the orange card  Referral to [facility] adult dental made

## 2021-03-07 NOTE — Assessment & Plan Note (Signed)
Hypothyroidism stable at this time  Continue Synthroid as prescribed  Thyroid function last visit were normal

## 2021-03-07 NOTE — Patient Instructions (Signed)
COVID-19 Vaccine Information can be found at: [email or web address] For questions related to vaccine distribution or appointments, please email [email or web address] or call [phone].    

## 2021-03-07 NOTE — Progress Notes (Signed)
Follow up visit

## 2021-03-07 NOTE — Assessment & Plan Note (Signed)
Vitamin D deficiency now on vitamin D supplementation  Refills on vitamin D made

## 2021-04-03 ENCOUNTER — Other Ambulatory Visit: Payer: Self-pay

## 2021-04-03 MED FILL — Ergocalciferol Cap 1.25 MG (50000 Unit): ORAL | 28 days supply | Qty: 4 | Fill #0 | Status: AC

## 2021-04-03 MED FILL — Levothyroxine Sodium Tab 125 MCG: ORAL | 30 days supply | Qty: 30 | Fill #0 | Status: AC

## 2021-05-07 NOTE — Progress Notes (Signed)
Subjective:    Patient ID: Sierra Novak, female    DOB: Jul 31, 1976, 45 y.o.   MRN: [id number] History of Present Illness:  45 y.o.F not seen here since 03/2017, here to re establish PCP. 10/2020 This patient's had 2 previous C-sections one in which her baby did not live the second when she had twins they are both alive and well since the C-section she has had some abdominal pain around the incision site it was an emergency C-section.  The abdominal pain is subumbilical.  There was a lump there that appeared 7 months ago seems to be less at this time.  Note she has history of vitamin D deficiency but she has been off supplements for 3 years.  She is due up a flu vaccine and tetanus vaccine and wishes to receive these.  Her depression screen on arrival today is PHQ-9 of 5.  She needs a hepatitis C screen.  She is also due up a Pap smear.  She has no other complaints at this time.  She states her thyroid condition was diagnosed 3 years ago with a blood test.  She has had no imaging studies and she is on 125 mcg of levothyroxine daily.  Only allergies are shellfish.  She has no other complaints at this time.  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  She does not work outside the home.  She is up to date on her Covid vaccines both occurring in April she cannot member whether it has been [place] or [employer]  03/07/2021 The patient is seen by way of a telephone visit and is overall doing well her only complaints is pain in the right upper jaw from the front to the back teeth.  She had a referral sent in December but has not gone through.  She now has the orange card.  Note the patient is already had a Pap smear and was negative.  She has no other complaints at this visit.  05/08/21 This a pleasant 45 year old female seen in return follow-up for primary care for hypothyroidism.  Patient also states she is having increased symptoms of allergies with  runny nose itchiness in the ears and in the eyes nasal congestion such that she has difficulty sleeping at night.  Note she does have a dental appointment for her tooth issue and she will follow-up on this.  Patient does have history of hypothyroidism is on levothyroxine at the last visit her thyroid function was normal.  Patient is also in need of a COVID booster she states she will proceed with this.  The language barrier Spanish was overcome by the use of video interpreter Orson Slick [phone]  Patient has no other complaints   Past Medical History:  Diagnosis Date  . Complete miscarriage 01/14/2019  . Thyroid disease      Family History  Problem Relation Age of Onset  . Hypertension Father   . Diabetes Paternal Aunt      Social History   Socioeconomic History  . Marital status: Legally Separated    Spouse name: Not on file  . Number of children: Not on file  . Years of education: Not on file  . Highest education level: Not on file  Occupational History  . Not on file  Tobacco Use  . Smoking status: Never Smoker  . Smokeless tobacco: Never Used  Substance and Sexual Activity  . Alcohol use: No    Alcohol/week: 0.0 standard  drinks  . Drug use: No  . Sexual activity: Yes    Comment: 1ST INTERCOURSE- 21, PARTNERS- 1  Other Topics Concern  . Not on file  Social History Narrative   ** Merged History Encounter **       Social Determinants of Health   Financial Resource Strain: Not on file  Food Insecurity: Not on file  Transportation Needs: Not on file  Physical Activity: Not on file  Stress: Not on file  Social Connections: Not on file  Intimate Partner Violence: Not on file     Allergies  Allergen Reactions  . Shrimp [Shellfish Allergy]      Outpatient Medications Prior to Visit  Medication Sig Dispense Refill  . levothyroxine (SYNTHROID) 125 MCG tablet TAKE 1 TABLET (125 MCG TOTAL) BY MOUTH DAILY. 90 tablet 3  . Vitamin D, Ergocalciferol, (DRISDOL) 1.25 MG  (50000 UNIT) CAPS capsule TAKE 1 CAPSULE (50,000 UNITS TOTAL) BY MOUTH EVERY 7 (SEVEN) DAYS. 12 capsule 2   No facility-administered medications prior to visit.      Review of Systems  Constitutional: Negative.   HENT: Positive for congestion, dental problem, postnasal drip, rhinorrhea, sneezing and sore throat. Negative for ear discharge, ear pain, facial swelling and nosebleeds.   Eyes: Negative.   Respiratory: Negative.   Cardiovascular: Negative.   Gastrointestinal: Negative for abdominal distention, abdominal pain, blood in stool, diarrhea and nausea.  Endocrine: Negative.   Genitourinary: Negative.   Musculoskeletal: Negative.   Skin: Negative.   Allergic/Immunologic: Negative.   Hematological: Negative.   Psychiatric/Behavioral: Negative.        Objective:   Physical Exam  Vitals:   05/08/21 1044  BP: 116/78  Pulse: 61  Resp: 16  SpO2: 98%  Weight: 224 lb 9.6 oz (101.9 kg)  Height: 5\' 2"  (1.575 m)    Gen: Pleasant, well-nourished, in no distress,  normal affect  ENT: Mild turbinate edema without purulence,  mouth clear,  oropharynx clear, moderate postnasal drip  Neck: No JVD, no TMG, no carotid bruits  Lungs: No use of accessory muscles, no dullness to percussion, clear without rales or rhonchi  Cardiovascular: RRR, heart sounds normal, no murmur or gallops, no peripheral edema  Abdomen: soft and NT, no HSM,  BS normal  Musculoskeletal: No deformities, no cyanosis or clubbing  Neuro: alert, non focal  Skin: Warm, no lesions or rashes  Lab Results  Component Value Date   TSH 1.660 11/07/2020   T4TOTAL 10.1 11/07/2020          Assessment & Plan:  I personally reviewed all images and lab data in the [facility] system as well as any outside material available during this office visit and agree with the  radiology impressions.   Acquired hypothyroidism Hypothyroidism stable at this time  Continue levothyroxine current dose  Vitamin D  deficiency Patient has refills on vitamin D continue this weekly  Chronic dental pain Patient now has the orange card knows to keep a follow-up appointment with dental  Allergic rhinitis Allergic rhinitis due to pollen seasonal in nature  Begin a trial cetirizine daily and Flonase 2 sprays each nostril daily  BMI 40.0-44.9, adult (HCC) Elevated BMI of 41  I went over with the patient proper diet and exercise program and gave her patient education   Sierra Novak was seen today for hypothyroidism.  Diagnoses and all orders for this visit:  Acquired hypothyroidism  Vitamin D deficiency  Chronic dental pain  Seasonal allergic rhinitis due to pollen  BMI 40.0-44.9,  adult [facility])  Other orders -     Vitamin D, Ergocalciferol, (DRISDOL) 1.25 MG (50000 UNIT) CAPS capsule; TAKE 1 CAPSULE (50,000 UNITS TOTAL) BY MOUTH EVERY 7 (SEVEN) DAYS. -     fluticasone (FLONASE) 50 MCG/ACT nasal spray; Place 2 sprays into both nostrils daily. -     cetirizine (ZYRTEC) 10 MG tablet; Take 1 tablet (10 mg total) by mouth daily.  Approximately 18 minutes spent going over this patient's chart examining her discussing her care with the interpreter and moderate decision making complexity including education on diet and exercise for elevated BMI  I also spent about 3 to 5 minutes discussing importance of the COVID booster vaccine she says she will follow through on this

## 2021-05-08 ENCOUNTER — Encounter: Payer: Self-pay | Admitting: Critical Care Medicine

## 2021-05-08 ENCOUNTER — Ambulatory Visit: Payer: Self-pay | Attending: Critical Care Medicine | Admitting: Critical Care Medicine

## 2021-05-08 ENCOUNTER — Other Ambulatory Visit: Payer: Self-pay

## 2021-05-08 VITALS — BP 116/78 | HR 61 | Resp 16 | Ht 62.0 in | Wt 224.6 lb

## 2021-05-08 DIAGNOSIS — K089 Disorder of teeth and supporting structures, unspecified: Secondary | ICD-10-CM

## 2021-05-08 DIAGNOSIS — J309 Allergic rhinitis, unspecified: Secondary | ICD-10-CM | POA: Insufficient documentation

## 2021-05-08 DIAGNOSIS — J301 Allergic rhinitis due to pollen: Secondary | ICD-10-CM

## 2021-05-08 DIAGNOSIS — E559 Vitamin D deficiency, unspecified: Secondary | ICD-10-CM

## 2021-05-08 DIAGNOSIS — E039 Hypothyroidism, unspecified: Secondary | ICD-10-CM

## 2021-05-08 DIAGNOSIS — G8929 Other chronic pain: Secondary | ICD-10-CM

## 2021-05-08 DIAGNOSIS — Z6841 Body Mass Index (BMI) 40.0 and over, adult: Secondary | ICD-10-CM

## 2021-05-08 MED ORDER — CETIRIZINE HCL 10 MG PO TABS
10.0000 mg | ORAL_TABLET | Freq: Every day | ORAL | 11 refills | Status: DC
Start: 2021-05-08 — End: 2022-02-07
  Filled 2021-05-08: qty 30, 30d supply, fill #0

## 2021-05-08 MED ORDER — FLUTICASONE PROPIONATE 50 MCG/ACT NA SUSP
2.0000 | Freq: Every day | NASAL | 6 refills | Status: DC
  Filled 2021-05-08: qty 16, 30d supply, fill #0

## 2021-05-08 MED ORDER — VITAMIN D (ERGOCALCIFEROL) 1.25 MG (50000 UNIT) PO CAPS
ORAL_CAPSULE | ORAL | 2 refills | Status: DC
  Filled 2021-08-11: qty 12, 84d supply, fill #0
  Filled 2021-11-13: qty 12, 84d supply, fill #1

## 2021-05-08 MED FILL — Ergocalciferol Cap 1.25 MG (50000 Unit): ORAL | 84 days supply | Qty: 12 | Fill #1 | Status: AC

## 2021-05-08 MED FILL — Levothyroxine Sodium Tab 125 MCG: ORAL | 30 days supply | Qty: 30 | Fill #1 | Status: AC

## 2021-05-08 NOTE — Assessment & Plan Note (Signed)
Allergic rhinitis due to pollen seasonal in nature  Begin a trial cetirizine daily and Flonase 2 sprays each nostril daily

## 2021-05-08 NOTE — Patient Instructions (Signed)
Start cetirizine 1 daily and fluticasone 2 sprays each nostril daily for your allergies  Stay on vitamin D weekly, stay on levothyroxine daily and you have additional refills here for those medications at our pharmacy  Please keep your upcoming dental appointment  Please obtain a COVID booster below is where you can locate an appointment for a booster shot recommending Pfizer COVID-19 Vaccine Information can be found at: [email or web address] For questions related to vaccine distribution or appointments, please email [email or web address] or call [phone].    Return to see Dr. Delford Field in 4 months  Follow healthy diet as outlined below  Continue your walking program  Comience con cetirizina 1 al da y fluticasona 2 pulverizaciones en cada fosa nasal al da para sus alergias.  Mantngase con vitamina D semanalmente, mantngase con levotiroxina todos los [phone] y tiene resurtidos adicionales aqu para esos medicamentos en nuestra farmacia.  Por favor mantenga su prxima cita dental  Obtenga un refuerzo COVID a continuacin es donde puede [place] una cita para una vacuna de refuerzo recomendando [employer] Puede encontrar informacin sobre la vacuna COVID-19 en: [email or web address] Para preguntas relacionadas con la distribucin de vacunas o citas, enve un correo electrnico a [email or web address] o llame al [phone].   Volver a ver al Dr. Delford Field en 4 meses  Siga una dieta saludable como se describe a continuacin  Contine con su programa de caminata   Obesidad en los adultos Obesity, Adult La obesidad es un exceso de Art gallery manager. Ser obeso significa que su peso es ms de lo que es saludable para usted. El [facility] es un nmero que indica la cantidad de grasa corporal que tiene una persona. Si usted tiene un ndice de masa corporal (IMC) de 30o ms, esto significa que  es obeso. A menudo, la causa de la obesidad es comer o beber ms caloras que las que el cuerpo [place]. Cambiar el estilo de vida puede ayudarlo a Publishing copy de [place]. La obesidad puede causar problemas de salud graves, como los siguientes:  Accidente cerebrovascular.  Arteriopata coronaria (EAC).  Diabetes tipo 2.  Algunos tipos de cncer, incluido el cncer de colon, mama, tero y vescula.  Artrosis.  Presin arterial alta (hipertensin arterial).  Colesterol alto.  Apnea del sueo.  Clculos en la vescula.  Problemas de esterilidad. Cules son las causas?  Consumir todos los [facility] con altos niveles de [place], International aid/development worker y [place].  Nacer con genes que pueden hacerlo ms propenso a ser obeso.  Tener una afeccin mdica que causa obesidad.  Tomar ciertos medicamentos.  Permanecer mucho tiempo sentado (tener un estilo de vida sedentario).  No dormir lo suficiente.  Beber gran cantidad de bebidas con azcar. Qu incrementa el riesgo?  Tener antecedentes familiares de obesidad.  Ser mujer afroamericana.  Ser hombre de origen hispano.  Vivir en un rea con acceso limitado a las siguientes posibilidades: ? Parques, centros recreativos o veredas. ? Alimentos saludables, como se venden en tiendas de comestibles y mercados de Event organiser. Cules son los signos o los sntomas? El principal signo es tener demasiada grasa corporal. Cmo se trata?  El tratamiento de esta afeccin frecuentemente incluye cambiar el estilo de vida. El tratamiento puede incluir: ? Cambios en la dieta. Esto puede incluir crear un plan de alimentacin saludable. ? Realizar actividad fsica. Puede incluir una actividad que hace que el corazn lata ms rpido (ejercicio [place]) y Fish farm manager de Pensions consultant. Trabaje con su mdico para disear un programa que funcione para usted. ? Medicamentos para ayudarlo a Curator. Pueden  utilizarse si no puede perder 1 libra (450g) por semana despus de  6 semanas de alimentacin saludable y ms ejercicio. ? Tratar las afecciones que causan la obesidad. ? [place]. Las opciones pueden incluir bandas gstricas y bypass gstrico. Esto puede realizarse en las siguientes situaciones:  Otros tratamientos no mejoraron su afeccin.  Tiene un IMC de 40 o superior.  Tiene problemas de salud potencialmente mortales relacionados con la obesidad. Siga estas instrucciones en su casa: Comida y bebida  Siga las instrucciones del mdico respecto de las comidas y las bebidas. Su mdico puede recomendarle lo siguiente: ? Limitar las comidas rpidas, los dulces y las colaciones procesadas. ? Elegir opciones con bajo contenido de [place]. Por ejemplo, Counsellor de [employer]. ? Consumir 5o ms porciones de frutas o verduras por da. ? Comer en casa con ms frecuencia. Esto le da ms control sobre lo que come. ? Cuando coma afuera, elija alimentos saludables. ? Aprenda a leer las etiquetas de los alimentos. Esto le ayudar a aprender qu cantidad de alimento hay en una porcin. ? Tenga a mano colaciones con bajo contenido de grasas. ? Evite las bebidas que contengan mucha azcar. Estas incluyen refrescos, jugo de frutas, t helado con azcar y [place] saborizada.  Beba suficiente agua para mantener el pis (la [place]) de color amarillo plido.  No siga las dietas de [place].   Actividad fsica  Haga ejercicios con frecuencia, como se lo haya indicado el mdico. La mayora de los adultos deben hacer hasta de ejercicio de intensidad moderada cada semana.Pregntele al mdico lo siguiente: ? Los tipos de ejercicios que son seguros para usted. ? La frecuencia con la que [employer] ejercicios.  Precaliente y elongue adecuadamente antes de hacer actividad fsica.  Haga un estiramiento lento despus de la actividad (relajacin).  Descanse entre los perodos de [place]. Estilo de vida  Trabaje con su mdico y con un experto en  alimentacin (nutricionista) para establecer un objetivo de prdida de peso que sea adecuado para usted.  Limite el tiempo que pasa frente a una pantalla.  Busque formas de recompensarse que no incluyan alimentos.  No beba alcohol si: ? El mdico le indica que no lo haga. ? Est embarazada, puede estar embarazada o est tratando de quedar embarazada.  Si bebe alcohol: ? Limite la cantidad que bebe a lo siguiente:  De 0 a 1 medida por da para las mujeres.  De 0 a 2 medidas por da para los hombres. ? Est atento a la cantidad de alcohol que hay en las bebidas que toma. En los [place], una medida equivale a una botella de cerveza de 12oz ( ), un vaso de vino de 5oz ( ) o un vaso de una bebida alcohlica de alta graduacin de 1oz (19ml). Instrucciones generales  Lleve un diario de su prdida de peso. Esto puede ayudarlo a Youth worker de lo siguiente: ? Los alimentos que come. ? Cunto ejercicio realiza.  Tome los medicamentos de venta libre y los recetados solamente como se lo haya indicado el mdico.  Tome vitaminas y suplementos solamente como se lo haya indicado el mdico.  Considere participar en un grupo de apoyo.  Concurra a todas las visitas de [address] se lo haya indicado el mdico. Esto es importante. Comunquese con un mdico si:  No puede alcanzar su objetivo de prdida de peso despus de haber modificado su dieta y su estilo de vida durante 6semanas. [place] ayuda inmediatamente si:  Tiene  dificultad para respirar.  Tiene pensamientos acerca de lastimarse. Resumen  La obesidad es un exceso de Art gallery manager.  Ser obeso significa que su peso es ms de lo que es saludable para usted.  Trabaje con su mdico para establecer un objetivo de prdida de [place].  Haga actividad fsica con regularidad tal como le indic el mdico. Esta informacin no tiene Theme park manager el consejo del mdico. Asegrese de hacerle al mdico  cualquier pregunta que tenga. Document Revised: 09/11/2018 Document Reviewed: 09/11/2018 Elsevier Patient Education  2021 [employer].

## 2021-05-08 NOTE — Assessment & Plan Note (Signed)
Patient has refills on vitamin D continue this weekly

## 2021-05-08 NOTE — Assessment & Plan Note (Signed)
Hypothyroidism stable at this time  Continue levothyroxine current dose

## 2021-05-08 NOTE — Assessment & Plan Note (Signed)
Elevated BMI of 41  I went over with the patient proper diet and exercise program and gave her patient education

## 2021-05-08 NOTE — Assessment & Plan Note (Signed)
Patient now has the orange card knows to keep a follow-up appointment with dental

## 2021-05-19 ENCOUNTER — Other Ambulatory Visit: Payer: Self-pay

## 2021-05-19 ENCOUNTER — Ambulatory Visit: Payer: Self-pay | Attending: Critical Care Medicine

## 2021-06-09 ENCOUNTER — Other Ambulatory Visit: Payer: Self-pay

## 2021-06-09 MED FILL — Levothyroxine Sodium Tab 125 MCG: ORAL | 30 days supply | Qty: 30 | Fill #2 | Status: AC

## 2021-06-12 ENCOUNTER — Other Ambulatory Visit: Payer: Self-pay

## 2021-07-13 ENCOUNTER — Other Ambulatory Visit: Payer: Self-pay

## 2021-07-13 MED FILL — Levothyroxine Sodium Tab 125 MCG: ORAL | 30 days supply | Qty: 30 | Fill #3 | Status: AC

## 2021-08-10 ENCOUNTER — Other Ambulatory Visit: Payer: Self-pay

## 2021-08-10 MED FILL — Levothyroxine Sodium Tab 125 MCG: ORAL | 30 days supply | Qty: 30 | Fill #4 | Status: AC

## 2021-08-11 ENCOUNTER — Other Ambulatory Visit: Payer: Self-pay

## 2021-09-11 ENCOUNTER — Ambulatory Visit: Payer: Self-pay | Admitting: Critical Care Medicine

## 2021-09-12 ENCOUNTER — Other Ambulatory Visit: Payer: Self-pay

## 2021-09-12 MED FILL — Levothyroxine Sodium Tab 125 MCG: ORAL | 30 days supply | Qty: 30 | Fill #5 | Status: AC

## 2021-09-13 ENCOUNTER — Other Ambulatory Visit: Payer: Self-pay

## 2021-10-11 ENCOUNTER — Other Ambulatory Visit: Payer: Self-pay

## 2021-10-11 MED FILL — Levothyroxine Sodium Tab 125 MCG: ORAL | 30 days supply | Qty: 30 | Fill #6 | Status: AC

## 2021-11-13 ENCOUNTER — Other Ambulatory Visit: Payer: Self-pay

## 2021-11-13 MED FILL — Levothyroxine Sodium Tab 125 MCG: ORAL | 30 days supply | Qty: 30 | Fill #7 | Status: AC

## 2021-11-14 ENCOUNTER — Other Ambulatory Visit: Payer: Self-pay

## 2021-12-13 ENCOUNTER — Other Ambulatory Visit: Payer: Self-pay

## 2021-12-13 MED FILL — Levothyroxine Sodium Tab 125 MCG: ORAL | 30 days supply | Qty: 30 | Fill #8 | Status: AC

## 2021-12-15 ENCOUNTER — Telehealth: Payer: Self-pay | Admitting: Critical Care Medicine

## 2021-12-15 NOTE — Telephone Encounter (Signed)
I return Pt  call, Pt will call back the firsdt week in January

## 2021-12-15 NOTE — Telephone Encounter (Signed)
Copied from CRM [id number]. Topic: General - Inquiry >> Dec 14, 2021  3:26 PM Aretta Nip wrote: Reason for CRM: Pt needs Sierra Novak to return call for financial help re a current concern, FU as soon as possible @ [phone]

## 2022-01-02 ENCOUNTER — Telehealth: Payer: Self-pay | Admitting: Critical Care Medicine

## 2022-01-02 NOTE — Telephone Encounter (Signed)
I return Pt call, schedule a financial appt for 01/09/22 °

## 2022-01-02 NOTE — Telephone Encounter (Signed)
Pt is calling to schedule a Financial assistance appt with Mikle Bosworth. Pt states that she has a dental appt January 18,2022. And an appt with the doctor February 8,2023  Cb- [phone]

## 2022-01-09 ENCOUNTER — Ambulatory Visit: Payer: Self-pay | Attending: Critical Care Medicine

## 2022-01-09 ENCOUNTER — Other Ambulatory Visit: Payer: Self-pay

## 2022-01-17 ENCOUNTER — Other Ambulatory Visit: Payer: Self-pay

## 2022-01-17 MED FILL — Levothyroxine Sodium Tab 125 MCG: ORAL | 30 days supply | Qty: 30 | Fill #0 | Status: AC

## 2022-02-07 ENCOUNTER — Encounter: Payer: Self-pay | Admitting: Critical Care Medicine

## 2022-02-07 ENCOUNTER — Other Ambulatory Visit: Payer: Self-pay

## 2022-02-07 ENCOUNTER — Ambulatory Visit: Payer: Self-pay | Attending: Critical Care Medicine | Admitting: Critical Care Medicine

## 2022-02-07 VITALS — BP 122/83 | HR 59 | Resp 16 | Wt 213.0 lb

## 2022-02-07 DIAGNOSIS — M2669 Other specified disorders of temporomandibular joint: Secondary | ICD-10-CM | POA: Insufficient documentation

## 2022-02-07 DIAGNOSIS — Z6841 Body Mass Index (BMI) 40.0 and over, adult: Secondary | ICD-10-CM

## 2022-02-07 DIAGNOSIS — E559 Vitamin D deficiency, unspecified: Secondary | ICD-10-CM

## 2022-02-07 DIAGNOSIS — K089 Disorder of teeth and supporting structures, unspecified: Secondary | ICD-10-CM

## 2022-02-07 DIAGNOSIS — G8929 Other chronic pain: Secondary | ICD-10-CM

## 2022-02-07 DIAGNOSIS — J301 Allergic rhinitis due to pollen: Secondary | ICD-10-CM

## 2022-02-07 DIAGNOSIS — Z1211 Encounter for screening for malignant neoplasm of colon: Secondary | ICD-10-CM

## 2022-02-07 DIAGNOSIS — E039 Hypothyroidism, unspecified: Secondary | ICD-10-CM

## 2022-02-07 DIAGNOSIS — E782 Mixed hyperlipidemia: Secondary | ICD-10-CM | POA: Insufficient documentation

## 2022-02-07 DIAGNOSIS — Z23 Encounter for immunization: Secondary | ICD-10-CM

## 2022-02-07 MED ORDER — LEVOTHYROXINE SODIUM 125 MCG PO TABS
ORAL_TABLET | Freq: Every day | ORAL | 3 refills | Status: DC
  Filled 2022-02-07: qty 90, fill #0
  Filled 2022-02-16: qty 30, 30d supply, fill #0
  Filled 2022-03-20: qty 30, 30d supply, fill #1
  Filled 2022-04-18: qty 30, 30d supply, fill #2
  Filled 2022-06-19: qty 90, 90d supply, fill #3

## 2022-02-07 MED ORDER — FLUTICASONE PROPIONATE 50 MCG/ACT NA SUSP
2.0000 | Freq: Every day | NASAL | 6 refills | Status: DC
  Filled 2022-02-07: qty 16, 30d supply, fill #0
  Filled 2022-03-20: qty 16, 30d supply, fill #1

## 2022-02-07 MED ORDER — CETIRIZINE HCL 10 MG PO TABS
10.0000 mg | ORAL_TABLET | Freq: Every day | ORAL | 11 refills | Status: DC
  Filled 2022-02-07 (×2): qty 30, 30d supply, fill #0
  Filled 2022-03-20: qty 90, 90d supply, fill #1

## 2022-02-07 MED ORDER — VITAMIN D (ERGOCALCIFEROL) 1.25 MG (50000 UNIT) PO CAPS
ORAL_CAPSULE | ORAL | 2 refills | Status: DC
  Filled 2022-02-07: qty 4, 28d supply, fill #0
  Filled 2022-03-20: qty 12, 84d supply, fill #1
  Filled 2022-07-05: qty 12, 84d supply, fill #2

## 2022-02-07 NOTE — Assessment & Plan Note (Signed)
Patient given a healthy diet and exercise program to follow continue to lose weight

## 2022-02-07 NOTE — Patient Instructions (Signed)
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 Joya Gaskins 5 months  Se administr la vacuna contra la influenza  [place] en [employer] para un examen de la vista, esto costar $ 85, alternativamente, puede considerar comprar anteojos para la visin de cerca para leer. Suger 2.50 de fuerza, puede probar varios en cualquier farmacia o tienda de comestibles.  Tome Tylenol o ibuprofeno para el dolor de [place] e infrmele a su dentista acerca de su dolor de mandbula en el lado derecho.  Los laboratorios de [employer] funcin tiroidea, Development worker, international aid metablico, el [place], los niveles de vitamina D comprobados.  Todos los medicamentos fueron enviados a nuestra farmacia en la planta baja. Festus Holts puede usar Zyrtec en Flonase segn sea necesario durante la prxima temporada de Set designer.  Contine con una dieta saludable como lo est haciendo como comentamos.  Volver a ver al Dr. Joya Gaskins 5 meses

## 2022-02-07 NOTE — Assessment & Plan Note (Signed)
Patient encouraged to follow-up with dentist

## 2022-02-07 NOTE — Progress Notes (Signed)
Established Patient Office Visit  Subjective:  Patient ID: Sierra Novak, female    DOB: 1976-07-11  Age: 46 y.o. MRN: [id number]  CC:  Chief Complaint  Patient presents with   Thyroid Problem    HPI Sierra Novak presents for primary care follow-up.  This visit was assisted by Spanish video interpreter Debbrah Alar [phone] Since the last visit in May of this past year the patient is complained of some mild right jaw pain for the past several weeks.  She did have some dental work previously.  She is applying now for the arms car to go back to the dentist.  She has had some fillings placed in cavities.  She does complain of inability to see small print up close this is just been a recent change.  She is lost 10 pounds in weight.  She is due a flu vaccine and did agree to receive this.  She is on her thyroxine and would like to have her thyroid function rechecked.  She has no other complaints at this time.  She has seasonal allergies only does not need her Flonase and Zyrtec annually.  Past Medical History:  Diagnosis Date   Complete miscarriage 01/14/2019   Thyroid disease     Past Surgical History:  Procedure Laterality Date   CESAREAN SECTION     CHOLECYSTECTOMY      Family History  Problem Relation Age of Onset   Hypertension Father    Diabetes Paternal Aunt     Social History   Socioeconomic History   Marital status: Legally Separated    Spouse name: Not on file   Number of children: Not on file   Years of education: Not on file   Highest education level: Not on file  Occupational History   Not on file  Tobacco Use   Smoking status: Never   Smokeless tobacco: Never  Substance and Sexual Activity   Alcohol use: No    Alcohol/week: 0.0 standard drinks   Drug use: No   Sexual activity: Yes    Comment: 1ST INTERCOURSE- 71, PARTNERS- 1  Other Topics Concern   Not on file  Social History Narrative   ** Merged History Encounter **       Social  Determinants of Health   Financial Resource Strain: Not on file  Food Insecurity: Not on file  Transportation Needs: Not on file  Physical Activity: Not on file  Stress: Not on file  Social Connections: Not on file  Intimate Partner Violence: Not on file    Outpatient Medications Prior to Visit  Medication Sig Dispense Refill   levothyroxine (SYNTHROID) 125 MCG tablet TAKE 1 TABLET (125 MCG TOTAL) BY MOUTH DAILY. 90 tablet 3   Vitamin D, Ergocalciferol, (DRISDOL) 1.25 MG (50000 UNIT) CAPS capsule TAKE 1 CAPSULE (50,000 UNITS TOTAL) BY MOUTH EVERY 7 (SEVEN) DAYS. 12 capsule 2   cetirizine (ZYRTEC) 10 MG tablet Take 1 tablet (10 mg total) by mouth daily. (Patient not taking: Reported on 02/07/2022) 30 tablet 11   fluticasone (FLONASE) 50 MCG/ACT nasal spray Place 2 sprays into both nostrils daily. (Patient not taking: Reported on 02/07/2022) 16 g 6   No facility-administered medications prior to visit.    Allergies  Allergen Reactions   Shrimp [Shellfish Allergy]     ROS Review of Systems  Constitutional:  Negative for chills, diaphoresis and fever.  HENT:  Positive for dental problem. Negative for congestion, hearing loss, nosebleeds, sore throat and tinnitus.  Eyes:  Negative for photophobia and redness.  Respiratory:  Negative for cough, shortness of breath, wheezing and stridor.   Cardiovascular:  Negative for chest pain, palpitations and leg swelling.  Gastrointestinal:  Negative for abdominal pain, blood in stool, constipation, diarrhea, nausea and vomiting.  Endocrine: Negative for polydipsia.       Thyroid condition  Genitourinary:  Negative for dysuria, flank pain, frequency, hematuria and urgency.  Musculoskeletal:  Negative for back pain, myalgias and neck pain.  Skin:  Negative for rash.  Allergic/Immunologic: Negative for environmental allergies.  Neurological:  Negative for dizziness, tremors, seizures, weakness and headaches.  Hematological:  Does not bruise/bleed  easily.  Psychiatric/Behavioral:  Negative for suicidal ideas. The patient is not nervous/anxious.      Objective:    Physical Exam Vitals reviewed.  Constitutional:      Appearance: Normal appearance. She is well-developed. She is obese. She is not diaphoretic.  HENT:     Head: Normocephalic and atraumatic.     Right Ear: Tympanic membrane normal.     Left Ear: Tympanic membrane normal.     Nose: Nose normal. No nasal deformity, septal deviation, mucosal edema or rhinorrhea.     Right Sinus: No maxillary sinus tenderness or frontal sinus tenderness.     Left Sinus: No maxillary sinus tenderness or frontal sinus tenderness.     Mouth/Throat:     Mouth: Mucous membranes are moist.     Pharynx: Oropharynx is clear. No oropharyngeal exudate.     Comments: Right tympanomandibular joint tender Eyes:     General: No scleral icterus.    Conjunctiva/sclera: Conjunctivae normal.     Pupils: Pupils are equal, round, and reactive to light.  Neck:     Thyroid: No thyromegaly.     Vascular: No carotid bruit or JVD.     Trachea: Trachea normal. No tracheal tenderness or tracheal deviation.  Cardiovascular:     Rate and Rhythm: Normal rate and regular rhythm.     Chest Wall: PMI is not displaced.     Pulses: Normal pulses. No decreased pulses.     Heart sounds: Normal heart sounds, S1 normal and S2 normal. Heart sounds not distant. No murmur heard. No systolic murmur is present.  No diastolic murmur is present.    No friction rub. No gallop. No S3 or S4 sounds.  Pulmonary:     Effort: Pulmonary effort is normal. No tachypnea, accessory muscle usage or respiratory distress.     Breath sounds: Normal breath sounds. No stridor. No decreased breath sounds, wheezing, rhonchi or rales.  Chest:     Chest wall: No tenderness.  Abdominal:     General: Bowel sounds are normal. There is no distension.     Palpations: Abdomen is soft. Abdomen is not rigid.     Tenderness: There is no abdominal  tenderness. There is no guarding or rebound.  Musculoskeletal:        General: Normal range of motion.     Cervical back: Normal range of motion and neck supple. No edema, erythema or rigidity. No muscular tenderness. Normal range of motion.  Lymphadenopathy:     Head:     Right side of head: No submental or submandibular adenopathy.     Left side of head: No submental or submandibular adenopathy.     Cervical: No cervical adenopathy.  Skin:    General: Skin is warm and dry.     Coloration: Skin is not pale.     Findings:  No rash.     Nails: There is no clubbing.  Neurological:     General: No focal deficit present.     Mental Status: She is alert and oriented to person, place, and time.     Sensory: No sensory deficit.  Psychiatric:        Mood and Affect: Mood normal.        Speech: Speech normal.        Behavior: Behavior normal.        Thought Content: Thought content normal.    BP 122/83    Pulse (!) 59    Resp 16    Wt 213 lb (96.6 kg)    SpO2 98%    BMI 38.96 kg/m  Wt Readings from Last 3 Encounters:  02/07/22 213 lb (96.6 kg)  05/08/21 224 lb 9.6 oz (101.9 kg)  12/08/20 226 lb 8 oz (102.7 kg)     There are no preventive care reminders to display for this patient.   There are no preventive care reminders to display for this patient.  Lab Results  Component Value Date   TSH 1.660 11/07/2020   Lab Results  Component Value Date   WBC 6.4 01/13/2019   HGB 13.1 01/13/2019   HCT 39.2 01/13/2019   MCV 94.2 01/13/2019   PLT 236 01/13/2019   Lab Results  Component Value Date   NA 140 04/17/2017   K 4.3 04/17/2017   CO2 24 04/17/2017   GLUCOSE 97 04/17/2017   BUN 8 04/17/2017   CREATININE 0.54 (L) 04/17/2017   BILITOT 0.5 04/17/2017   ALKPHOS 56 04/17/2017   AST 17 04/17/2017   ALT 21 04/17/2017   PROT 6.8 04/17/2017   ALBUMIN 4.2 04/17/2017   CALCIUM 9.3 04/17/2017   Lab Results  Component Value Date   CHOL 187 04/17/2017   Lab Results  Component  Value Date   HDL 46 04/17/2017   Lab Results  Component Value Date   LDLCALC 125 (H) 04/17/2017   Lab Results  Component Value Date   TRIG 80 04/17/2017   Lab Results  Component Value Date   CHOLHDL 4.1 04/17/2017   Lab Results  Component Value Date   HGBA1C 5.5 07/23/2016      Assessment & Plan:   Problem List Items Addressed This Visit       Respiratory   Allergic rhinitis    I have refilled Zyrtec and Flonase        Endocrine   Acquired hypothyroidism - Primary    Continue Synthroid 125 mcg daily and reassess for thyroid function      Relevant Medications   levothyroxine (SYNTHROID) 125 MCG tablet   Other Relevant Orders   Comprehensive metabolic panel   Thyroid Panel With TSH     Other   Vitamin D deficiency    Reassess vitamin D levels  Continue weekly vitamin D supplement      Relevant Orders   VITAMIN D 25 Hydroxy (Vit-D Deficiency, Fractures)   Chronic dental pain    Patient encouraged to follow-up with dentist      BMI 40.0-44.9, adult ([place])    Patient given a healthy diet and exercise program to follow continue to lose weight      TMJ inflammation    Recommended patient assess with dentist within next visit in the following week recommend she use ibuprofen or Tylenol as needed for jaw pain      Mixed hyperlipidemia    Prior elevations  in lipids we will repeat check panel currently not on therapy monitoring with diet alone      Relevant Orders   Lipid panel   Other Visit Diagnoses     Colon cancer screening       Relevant Orders   Fecal occult blood, imunochemical   Need for immunization against influenza       Relevant Orders   Flu Vaccine QUAD 71mo+IM (Fluarix, Fluzone & Alfiuria Quad PF) (Completed)       Meds ordered this encounter  Medications   levothyroxine (SYNTHROID) 125 MCG tablet    Sig: TAKE 1 TABLET (125 MCG TOTAL) BY MOUTH DAILY.    Dispense:  90 tablet    Refill:  3   Vitamin D, Ergocalciferol, (DRISDOL)  1.25 MG (50000 UNIT) CAPS capsule    Sig: TAKE 1 CAPSULE (50,000 UNITS TOTAL) BY MOUTH EVERY 7 (SEVEN) DAYS.    Dispense:  12 capsule    Refill:  2   cetirizine (ZYRTEC) 10 MG tablet    Sig: Take 1 tablet (10 mg total) by mouth daily.    Dispense:  30 tablet    Refill:  11   fluticasone (FLONASE) 50 MCG/ACT nasal spray    Sig: Place 2 sprays into both nostrils daily.    Dispense:  16 g    Refill:  6    Follow-up: Return in about 5 months (around 07/07/2022).    Asencion Noble, MD

## 2022-02-07 NOTE — Assessment & Plan Note (Signed)
Reassess vitamin D levels  Continue weekly vitamin D supplement

## 2022-02-07 NOTE — Assessment & Plan Note (Signed)
Prior elevations in lipids we will repeat check panel currently not on therapy monitoring with diet alone

## 2022-02-07 NOTE — Assessment & Plan Note (Signed)
I have refilled Zyrtec and Flonase

## 2022-02-07 NOTE — Assessment & Plan Note (Signed)
Continue Synthroid 125 mcg daily and reassess for thyroid function

## 2022-02-07 NOTE — Assessment & Plan Note (Signed)
Recommended patient assess with dentist within next visit in the following week recommend she use ibuprofen or Tylenol as needed for jaw pain

## 2022-02-08 ENCOUNTER — Other Ambulatory Visit: Payer: Self-pay

## 2022-02-08 ENCOUNTER — Other Ambulatory Visit: Payer: Self-pay | Admitting: Critical Care Medicine

## 2022-02-08 DIAGNOSIS — E782 Mixed hyperlipidemia: Secondary | ICD-10-CM

## 2022-02-08 LAB — COMPREHENSIVE METABOLIC PANEL
ALT: 16 IU/L (ref 0–32)
AST: 21 IU/L (ref 0–40)
Albumin/Globulin Ratio: 1.6 (ref 1.2–2.2)
Albumin: 4.2 g/dL (ref 3.8–4.8)
Alkaline Phosphatase: 68 IU/L (ref 44–121)
BUN/Creatinine Ratio: 15 (ref 9–23)
BUN: 9 mg/dL (ref 6–24)
Bilirubin Total: 0.5 mg/dL (ref 0.0–1.2)
CO2: 23 mmol/L (ref 20–29)
Calcium: 9.5 mg/dL (ref 8.7–10.2)
Chloride: 104 mmol/L (ref 96–106)
Creatinine, Ser: 0.6 mg/dL (ref 0.57–1.00)
Globulin, Total: 2.6 g/dL (ref 1.5–4.5)
Glucose: 98 mg/dL (ref 70–99)
Potassium: 4.9 mmol/L (ref 3.5–5.2)
Sodium: 137 mmol/L (ref 134–144)
Total Protein: 6.8 g/dL (ref 6.0–8.5)
eGFR: 112 mL/min/{1.73_m2} (ref >59)

## 2022-02-08 LAB — LIPID PANEL
Chol/HDL Ratio: 3.4 ratio (ref 0.0–4.4)
Cholesterol, Total: 201 mg/dL — ABNORMAL HIGH (ref 100–199)
HDL: 59 mg/dL (ref >39)
LDL Chol Calc (NIH): 125 mg/dL — ABNORMAL HIGH (ref 0–99)
Triglycerides: 94 mg/dL (ref 0–149)
VLDL Cholesterol Cal: 17 mg/dL (ref 5–40)

## 2022-02-08 LAB — THYROID PANEL WITH TSH
Free Thyroxine Index: 2.1 (ref 1.2–4.9)
T3 Uptake Ratio: 25 % (ref 24–39)
T4, Total: 8.3 ug/dL (ref 4.5–12.0)
TSH: 1.79 u[IU]/mL (ref 0.450–4.500)

## 2022-02-08 LAB — VITAMIN D 25 HYDROXY (VIT D DEFICIENCY, FRACTURES): Vit D, 25-Hydroxy: 33.9 ng/mL (ref 30.0–100.0)

## 2022-02-08 MED ORDER — ATORVASTATIN CALCIUM 10 MG PO TABS
10.0000 mg | ORAL_TABLET | Freq: Every day | ORAL | 3 refills | Status: DC
  Filled 2022-02-08: qty 30, 30d supply, fill #0
  Filled 2022-03-20: qty 90, 90d supply, fill #1
  Filled 2022-07-05: qty 90, 90d supply, fill #2

## 2022-02-09 ENCOUNTER — Telehealth: Payer: Self-pay

## 2022-02-09 NOTE — Telephone Encounter (Signed)
-----   Message from Storm Frisk, MD sent at 02/08/2022  6:39 AM EST ----- Let pt know cholesterol high, I recommend low dose cholesterol pill this was sent to the pharmacy,  thyroid vit D and liver kidney all normal  no other med changes

## 2022-02-09 NOTE — Telephone Encounter (Signed)
Pt was called and is aware of results, DOB was confirmed.  ° °interpreter ID#[id number] °

## 2022-02-16 ENCOUNTER — Other Ambulatory Visit: Payer: Self-pay

## 2022-02-18 LAB — FECAL OCCULT BLOOD, IMMUNOCHEMICAL: Fecal Occult Bld: NEGATIVE

## 2022-02-19 ENCOUNTER — Telehealth: Payer: Self-pay

## 2022-02-19 NOTE — Telephone Encounter (Signed)
Pt was called but both numbers were invalid, routed message to nurse pool and will send a letter out   Name: Sue Lush ID# [id number]

## 2022-02-19 NOTE — Telephone Encounter (Signed)
-----   Message from Storm Frisk, MD sent at 02/18/2022  8:01 AM EST ----- Let pt know fecal occult is NEG repeat one year

## 2022-03-20 ENCOUNTER — Other Ambulatory Visit: Payer: Self-pay

## 2022-03-21 ENCOUNTER — Other Ambulatory Visit: Payer: Self-pay

## 2022-04-18 ENCOUNTER — Other Ambulatory Visit: Payer: Self-pay

## 2022-06-19 ENCOUNTER — Other Ambulatory Visit: Payer: Self-pay

## 2022-07-05 ENCOUNTER — Other Ambulatory Visit: Payer: Self-pay

## 2022-07-06 ENCOUNTER — Other Ambulatory Visit: Payer: Self-pay

## 2022-08-15 ENCOUNTER — Encounter: Payer: Self-pay | Admitting: Critical Care Medicine

## 2022-08-15 ENCOUNTER — Other Ambulatory Visit: Payer: Self-pay

## 2022-08-15 ENCOUNTER — Ambulatory Visit: Payer: Self-pay | Attending: Critical Care Medicine | Admitting: Critical Care Medicine

## 2022-08-15 VITALS — BP 131/85 | HR 4 | Temp 98.2°F | Ht 68.0 in | Wt 217.0 lb

## 2022-08-15 DIAGNOSIS — E039 Hypothyroidism, unspecified: Secondary | ICD-10-CM

## 2022-08-15 DIAGNOSIS — E782 Mixed hyperlipidemia: Secondary | ICD-10-CM

## 2022-08-15 DIAGNOSIS — Z1272 Encounter for screening for malignant neoplasm of vagina: Secondary | ICD-10-CM

## 2022-08-15 DIAGNOSIS — K089 Disorder of teeth and supporting structures, unspecified: Secondary | ICD-10-CM

## 2022-08-15 DIAGNOSIS — G8929 Other chronic pain: Secondary | ICD-10-CM

## 2022-08-15 DIAGNOSIS — J301 Allergic rhinitis due to pollen: Secondary | ICD-10-CM

## 2022-08-15 DIAGNOSIS — E559 Vitamin D deficiency, unspecified: Secondary | ICD-10-CM

## 2022-08-15 MED ORDER — VITAMIN D (ERGOCALCIFEROL) 1.25 MG (50000 UNIT) PO CAPS
ORAL_CAPSULE | ORAL | 2 refills | Status: DC
  Filled 2022-08-15: qty 12, fill #0

## 2022-08-15 MED ORDER — ATORVASTATIN CALCIUM 10 MG PO TABS
10.0000 mg | ORAL_TABLET | Freq: Every day | ORAL | 3 refills | Status: DC
  Filled 2022-08-15: qty 90, 90d supply, fill #0

## 2022-08-15 MED ORDER — LEVOTHYROXINE SODIUM 125 MCG PO TABS
ORAL_TABLET | Freq: Every day | ORAL | 3 refills | Status: DC
  Filled 2022-08-15: qty 90, fill #0

## 2022-08-15 NOTE — Patient Instructions (Signed)
All of your medications have been sent to the downstairs pharmacy Recommend [facility]: [address] [place], [place] [address] Please read and follow recommendations in the lifestyle handout provided to you today  An appointment with a female provider will be made for a papsmear  Return to see Dr Delford Field in 5 months   Todos sus medicamentos han sido enviados a la farmacia de [place]. Recomendar [facility]: [address], [place], [place] [address] Clint Lipps y siga las recomendaciones en el folleto de estilo de vida que se le entreg hoy. Se programar una cita con una proveedora para una prueba de Papanicolaou [place] a ver al Dr. Delford Field en 5 meses

## 2022-08-15 NOTE — Assessment & Plan Note (Signed)
Continue weekly Vitamin D supplementation, refills sent today

## 2022-08-15 NOTE — Assessment & Plan Note (Signed)
Continue Synthroid daily, refills sent

## 2022-08-15 NOTE — Assessment & Plan Note (Signed)
Continue atorvastatin 10 mg daily, refills sent today

## 2022-08-15 NOTE — Assessment & Plan Note (Signed)
Per patient, last Pap smear was about 3 years ago that was normal. No acute complaints.  Will schedule her with one of our female providers

## 2022-08-15 NOTE — Assessment & Plan Note (Addendum)
Asymptomatic at this time. Continue use of zyrtec and flonase as needed with worsening seasons

## 2022-08-15 NOTE — Progress Notes (Signed)
Established Patient Office Visit  Subjective   Patient ID: Sierra Novak, female    DOB: [birth year]  Age: 46 y.o. MRN: [id number]  Chief Complaint  Patient presents with   Hypothyroidism    Sierra Novak presents today for follow up. She has history of hypothyroidism, hyperlipidemia and seasonal allergic rhinitis. At the previous visit she was in the process of obtaining the orange card insurance. She needed to follow up with her dentist regarding her jaw and teeth pain. She has done so and had a tooth filling done earlier today.  At the last visit, she had a lipid panel completed. She was then placed on 10 mg of atorvastatin. She has been taking this daily.  She does have seasonal allergies, asymptomatic at this time. She uses zyrtec and flonase as needed.  She has history of hypothyroidism, currently on thyroxine daily. Levels were checked at our previous visit and were all within normal range.  She does not have an eye doctor and states she feels her vision is becoming blurry over time.  She has no acute complaints today, is feeling well. She is interested in getting a Papsmear, as her last one was about 3 years ago. Denies any urinary/vaginal, abdominal or general symptoms. Does note some intermittent hotflashes with sweating.   History obtained and visit completed via Spanish interpreter Johnna Acosta #[id number]     Patient Active Problem List   Diagnosis Date Noted   Follow-up vaginal Pap smear 08/15/2022   TMJ inflammation 02/07/2022   Mixed hyperlipidemia 02/07/2022   Allergic rhinitis 05/08/2021   BMI 40.0-44.9, adult ([facility]) 05/08/2021   Chronic dental pain 03/07/2021   Acquired hypothyroidism 11/07/2020   Vitamin D deficiency 09/28/2014   Past Medical History:  Diagnosis Date   Complete miscarriage 01/14/2019   Thyroid disease    Past Surgical History:  Procedure Laterality Date   CESAREAN SECTION     CHOLECYSTECTOMY     Social History   Tobacco Use    Smoking status: Never   Smokeless tobacco: Never  Substance Use Topics   Alcohol use: No    Alcohol/week: 0.0 standard drinks of alcohol   Drug use: No   Family History  Problem Relation Age of Onset   Hypertension Father    Diabetes Paternal Aunt    Allergies  Allergen Reactions   Shrimp [Shellfish Allergy]       Review of Systems  Constitutional:  Negative for fever and weight loss.  HENT:  Negative for congestion and sore throat.   Eyes:  Positive for blurred vision. Negative for pain.  Respiratory:  Negative for cough and wheezing.   Cardiovascular:  Negative for chest pain and palpitations.  Gastrointestinal:  Negative for constipation, diarrhea, nausea and vomiting.  Genitourinary:  Negative for frequency and urgency.  Musculoskeletal:  Negative for falls and neck pain.  Skin:  Negative for itching and rash.  Neurological:  Negative for weakness.       Hot flashes   Endo/Heme/Allergies:  Positive for environmental allergies.  Psychiatric/Behavioral:  Negative for suicidal ideas.       Objective:     BP 131/85   Pulse (!) 4   Temp 98.2 F (36.8 C) (Oral)   Ht 5\' 8"  (1.727 m)   Wt 217 lb (98.4 kg)   LMP 07/17/2022   SpO2 98%   BMI 32.99 kg/m     Physical Exam Constitutional:      Appearance: Normal appearance.  HENT:     Right  Ear: Tympanic membrane, ear canal and external ear normal.     Left Ear: Tympanic membrane, ear canal and external ear normal.     Nose: Nose normal.     Mouth/Throat:     Mouth: Mucous membranes are moist.     Pharynx: Oropharynx is clear.  Eyes:     Conjunctiva/sclera: Conjunctivae normal.  Cardiovascular:     Rate and Rhythm: Normal rate and regular rhythm.     Pulses: Normal pulses.     Heart sounds: Normal heart sounds.  Pulmonary:     Effort: Pulmonary effort is normal.     Breath sounds: Normal breath sounds.  Abdominal:     General: Bowel sounds are normal.     Palpations: Abdomen is soft.     Tenderness: There  is no abdominal tenderness.  Musculoskeletal:     Cervical back: Normal range of motion and neck supple.  Skin:    General: Skin is warm.     Capillary Refill: Capillary refill takes less than 2 seconds.  Neurological:     General: No focal deficit present.     Mental Status: She is alert and oriented to person, place, and time.  Psychiatric:        Mood and Affect: Mood normal.        Behavior: Behavior normal.      No results found for any visits on 08/15/22.     The 10-year ASCVD risk score (Arnett DK, et al., 2019) is: 0.9%    Assessment & Plan:   Problem List Items Addressed This Visit       Respiratory   Allergic rhinitis    Asymptomatic at this time. Continue use of zyrtec and flonase as needed with worsening seasons        Endocrine   Acquired hypothyroidism    Continue Synthroid daily, refills sent       Relevant Medications   levothyroxine (SYNTHROID) 125 MCG tablet     Other   Vitamin D deficiency    Continue weekly Vitamin D supplementation, refills sent today       Chronic dental pain    Asymptomatic at this time. Is followed by Dentistry, has had recent fillings completed       Mixed hyperlipidemia - Primary    Continue atorvastatin 10 mg daily, refills sent today       Relevant Medications   atorvastatin (LIPITOR) 10 MG tablet   Follow-up vaginal Pap smear    Per patient, last Pap smear was about 3 years ago that was normal. No acute complaints.  Will schedule her with one of our female providers        Return in about 5 months (around 01/15/2023) for thyroid, chronic conditions.    Shan Levans, MD

## 2022-08-15 NOTE — Assessment & Plan Note (Signed)
Asymptomatic at this time. Is followed by Dentistry, has had recent fillings completed

## 2022-08-15 NOTE — Progress Notes (Deleted)
Established Patient Office Visit  Subjective:  Patient ID: Sierra Novak, female    DOB: 1976/10/08  Age: 46 y.o. MRN: [id number]  CC:  No chief complaint on file.   HPI Sierra Novak presents for primary care follow-up.  This visit was assisted by Spanish video interpreter Lacie Scotts [phone] Since the last visit in May of this past year the patient is complained of some mild right jaw pain for the past several weeks.  She did have some dental work previously.  She is applying now for the arms car to go back to the dentist.  She has had some fillings placed in cavities.  She does complain of inability to see small print up close this is just been a recent change.  She is lost 10 pounds in weight.  She is due a flu vaccine and did agree to receive this.  She is on her thyroxine and would like to have her thyroid function rechecked.  She has no other complaints at this time.  She has seasonal allergies only does not need her Flonase and Zyrtec annually.  8/16    Respiratory   Allergic rhinitis    I have refilled Zyrtec and Flonase        Endocrine   Acquired hypothyroidism - Primary    Continue Synthroid 125 mcg daily and reassess for thyroid function      Relevant Medications   levothyroxine (SYNTHROID) 125 MCG tablet   Other Relevant Orders   Comprehensive metabolic panel   Thyroid Panel With TSH     Other   Vitamin D deficiency    Reassess vitamin D levels  Continue weekly vitamin D supplement      Relevant Orders   VITAMIN D 25 Hydroxy (Vit-D Deficiency, Fractures)   Chronic dental pain    Patient encouraged to follow-up with dentist      BMI 40.0-44.9, adult [facility])    Patient given a healthy diet and exercise program to follow continue to lose weight      TMJ inflammation    Recommended patient assess with dentist within next visit in the following week recommend she use ibuprofen or Tylenol as needed for jaw pain      Mixed hyperlipidemia     Prior elevations in lipids we will repeat check panel currently not on therapy monitoring with diet alone      Relevant Orders   Lipid panel   Past Medical History:  Diagnosis Date   Complete miscarriage 01/14/2019   Thyroid disease     Past Surgical History:  Procedure Laterality Date   CESAREAN SECTION     CHOLECYSTECTOMY      Family History  Problem Relation Age of Onset   Hypertension Father    Diabetes Paternal Aunt     Social History   Socioeconomic History   Marital status: Legally Separated    Spouse name: Not on file   Number of children: Not on file   Years of education: Not on file   Highest education level: Not on file  Occupational History   Not on file  Tobacco Use   Smoking status: Never   Smokeless tobacco: Never  Substance and Sexual Activity   Alcohol use: No    Alcohol/week: 0.0 standard drinks of alcohol   Drug use: No   Sexual activity: Yes    Comment: 1ST INTERCOURSE- 72, PARTNERS- 1  Other Topics Concern   Not on file  Social History Narrative   **  Merged History Encounter **       Social Determinants of Health   Financial Resource Strain: Not on file  Food Insecurity: Not on file  Transportation Needs: Not on file  Physical Activity: Not on file  Stress: Not on file  Social Connections: Not on file  Intimate Partner Violence: Not on file    Outpatient Medications Prior to Visit  Medication Sig Dispense Refill   atorvastatin (LIPITOR) 10 MG tablet Take 1 tablet (10 mg total) by mouth daily. 90 tablet 3   cetirizine (ZYRTEC) 10 MG tablet Take 1 tablet (10 mg total) by mouth daily. 30 tablet 11   fluticasone (FLONASE) 50 MCG/ACT nasal spray Place 2 sprays into both nostrils daily. 16 g 6   levothyroxine (SYNTHROID) 125 MCG tablet TAKE 1 TABLET (125 MCG TOTAL) BY MOUTH DAILY. 90 tablet 3   Vitamin D, Ergocalciferol, (DRISDOL) 1.25 MG (50000 UNIT) CAPS capsule TAKE 1 CAPSULE (50,000 UNITS TOTAL) BY MOUTH EVERY 7 (SEVEN) DAYS. 12  capsule 2   No facility-administered medications prior to visit.    Allergies  Allergen Reactions   Shrimp [Shellfish Allergy]     ROS Review of Systems  Constitutional:  Negative for chills, diaphoresis and fever.  HENT:  Positive for dental problem. Negative for congestion, hearing loss, nosebleeds, sore throat and tinnitus.   Eyes:  Negative for photophobia and redness.  Respiratory:  Negative for cough, shortness of breath, wheezing and stridor.   Cardiovascular:  Negative for chest pain, palpitations and leg swelling.  Gastrointestinal:  Negative for abdominal pain, blood in stool, constipation, diarrhea, nausea and vomiting.  Endocrine: Negative for polydipsia.       Thyroid condition  Genitourinary:  Negative for dysuria, flank pain, frequency, hematuria and urgency.  Musculoskeletal:  Negative for back pain, myalgias and neck pain.  Skin:  Negative for rash.  Allergic/Immunologic: Negative for environmental allergies.  Neurological:  Negative for dizziness, tremors, seizures, weakness and headaches.  Hematological:  Does not bruise/bleed easily.  Psychiatric/Behavioral:  Negative for suicidal ideas. The patient is not nervous/anxious.       Objective:    Physical Exam Vitals reviewed.  Constitutional:      Appearance: Normal appearance. She is well-developed. She is obese. She is not diaphoretic.  HENT:     Head: Normocephalic and atraumatic.     Right Ear: Tympanic membrane normal.     Left Ear: Tympanic membrane normal.     Nose: Nose normal. No nasal deformity, septal deviation, mucosal edema or rhinorrhea.     Right Sinus: No maxillary sinus tenderness or frontal sinus tenderness.     Left Sinus: No maxillary sinus tenderness or frontal sinus tenderness.     Mouth/Throat:     Mouth: Mucous membranes are moist.     Pharynx: Oropharynx is clear. No oropharyngeal exudate.     Comments: Right tympanomandibular joint tender Eyes:     General: No scleral  icterus.    Conjunctiva/sclera: Conjunctivae normal.     Pupils: Pupils are equal, round, and reactive to light.  Neck:     Thyroid: No thyromegaly.     Vascular: No carotid bruit or JVD.     Trachea: Trachea normal. No tracheal tenderness or tracheal deviation.  Cardiovascular:     Rate and Rhythm: Normal rate and regular rhythm.     Chest Wall: PMI is not displaced.     Pulses: Normal pulses. No decreased pulses.     Heart sounds: Normal heart sounds, S1 normal  and S2 normal. Heart sounds not distant. No murmur heard.    No systolic murmur is present.     No diastolic murmur is present.     No friction rub. No gallop. No S3 or S4 sounds.  Pulmonary:     Effort: Pulmonary effort is normal. No tachypnea, accessory muscle usage or respiratory distress.     Breath sounds: Normal breath sounds. No stridor. No decreased breath sounds, wheezing, rhonchi or rales.  Chest:     Chest wall: No tenderness.  Abdominal:     General: Bowel sounds are normal. There is no distension.     Palpations: Abdomen is soft. Abdomen is not rigid.     Tenderness: There is no abdominal tenderness. There is no guarding or rebound.  Musculoskeletal:        General: Normal range of motion.     Cervical back: Normal range of motion and neck supple. No edema, erythema or rigidity. No muscular tenderness. Normal range of motion.  Lymphadenopathy:     Head:     Right side of head: No submental or submandibular adenopathy.     Left side of head: No submental or submandibular adenopathy.     Cervical: No cervical adenopathy.  Skin:    General: Skin is warm and dry.     Coloration: Skin is not pale.     Findings: No rash.     Nails: There is no clubbing.  Neurological:     General: No focal deficit present.     Mental Status: She is alert and oriented to person, place, and time.     Sensory: No sensory deficit.  Psychiatric:        Mood and Affect: Mood normal.        Speech: Speech normal.        Behavior:  Behavior normal.        Thought Content: Thought content normal.     There were no vitals taken for this visit. Wt Readings from Last 3 Encounters:  02/07/22 213 lb (96.6 kg)  05/08/21 224 lb 9.6 oz (101.9 kg)  12/08/20 226 lb 8 oz (102.7 kg)     Health Maintenance Due  Topic Date Due   INFLUENZA VACCINE  07/31/2022     There are no preventive care reminders to display for this patient.  Lab Results  Component Value Date   TSH 1.790 02/07/2022   Lab Results  Component Value Date   WBC 6.4 01/13/2019   HGB 13.1 01/13/2019   HCT 39.2 01/13/2019   MCV 94.2 01/13/2019   PLT 236 01/13/2019   Lab Results  Component Value Date   NA 137 02/07/2022   K 4.9 02/07/2022   CO2 23 02/07/2022   GLUCOSE 98 02/07/2022   BUN 9 02/07/2022   CREATININE 0.60 02/07/2022   BILITOT 0.5 02/07/2022   ALKPHOS 68 02/07/2022   AST 21 02/07/2022   ALT 16 02/07/2022   PROT 6.8 02/07/2022   ALBUMIN 4.2 02/07/2022   CALCIUM 9.5 02/07/2022   EGFR 112 02/07/2022   Lab Results  Component Value Date   CHOL 201 (H) 02/07/2022   Lab Results  Component Value Date   HDL 59 02/07/2022   Lab Results  Component Value Date   LDLCALC 125 (H) 02/07/2022   Lab Results  Component Value Date   TRIG 94 02/07/2022   Lab Results  Component Value Date   CHOLHDL 3.4 02/07/2022   Lab Results  Component Value Date  HGBA1C 5.5 07/23/2016      Assessment & Plan:   Problem List Items Addressed This Visit   None  No orders of the defined types were placed in this encounter.   Follow-up: No follow-ups on file.    Asencion Noble, MD

## 2022-08-28 ENCOUNTER — Other Ambulatory Visit: Payer: Self-pay

## 2022-09-12 ENCOUNTER — Other Ambulatory Visit: Payer: Self-pay

## 2022-09-12 ENCOUNTER — Encounter: Payer: Self-pay | Admitting: Physician Assistant

## 2022-09-12 ENCOUNTER — Ambulatory Visit: Payer: Self-pay | Attending: Physician Assistant | Admitting: Physician Assistant

## 2022-09-12 VITALS — BP 120/78 | HR 56 | Ht 64.0 in | Wt 216.4 lb

## 2022-09-12 DIAGNOSIS — Z79899 Other long term (current) drug therapy: Secondary | ICD-10-CM

## 2022-09-12 DIAGNOSIS — Z789 Other specified health status: Secondary | ICD-10-CM

## 2022-09-12 DIAGNOSIS — E782 Mixed hyperlipidemia: Secondary | ICD-10-CM

## 2022-09-12 DIAGNOSIS — E039 Hypothyroidism, unspecified: Secondary | ICD-10-CM

## 2022-09-12 MED ORDER — LEVOTHYROXINE SODIUM 125 MCG PO TABS
ORAL_TABLET | Freq: Every day | ORAL | 3 refills | Status: DC
Start: 2022-09-12 — End: 2023-01-17
  Filled 2022-09-12 – 2022-09-25 (×2): qty 90, 90d supply, fill #0
  Filled 2023-01-04: qty 90, 90d supply, fill #1

## 2022-09-12 MED ORDER — ATORVASTATIN CALCIUM 10 MG PO TABS
10.0000 mg | ORAL_TABLET | Freq: Every day | ORAL | 3 refills | Status: DC
  Filled 2022-09-12 – 2022-10-04 (×2): qty 90, 90d supply, fill #0
  Filled 2023-01-04: qty 90, 90d supply, fill #1

## 2022-09-12 NOTE — Progress Notes (Signed)
Patient ID: Sierra Novak, female   DOB: 09/16/1976, 46 y.o.   MRN: [id number]   Sierra Novak, is a 46 y.o. female  QMG:[id number]  [id number]  DOB - March 02, 1976  No chief complaint on file.      Subjective:   Sierra Novak is a 46 y.o. female here today requesting a pap smear.  Her last pap was in 2021 and normal and her  PCP recommended next pap in 2026.  No issues or concerns.  She is due for lab work.  No issues or concerns.  Compliant with meds.    No problems updated.  ALLERGIES: Allergies  Allergen Reactions   Shrimp [Shellfish Allergy]     PAST MEDICAL HISTORY: Past Medical History:  Diagnosis Date   Complete miscarriage 01/14/2019   Thyroid disease     MEDICATIONS AT HOME: Prior to Admission medications   Medication Sig Start Date End Date Taking? Authorizing Provider  atorvastatin (LIPITOR) 10 MG tablet Take 1 tablet (10 mg total) by mouth daily. 09/12/22   Anders Simmonds, PA-C  cetirizine (ZYRTEC) 10 MG tablet Take 1 tablet (10 mg total) by mouth daily. Patient not taking: Reported on 08/15/2022 02/07/22   Storm Frisk, MD  fluticasone [facility]) 50 MCG/ACT nasal spray Place 2 sprays into both nostrils daily. Patient not taking: Reported on 08/15/2022 02/07/22   Storm Frisk, MD  levothyroxine (SYNTHROID) 125 MCG tablet TAKE 1 TABLET (125 MCG TOTAL) BY MOUTH DAILY. 09/12/22 09/12/23  Anders Simmonds, PA-C    ROS: Neg HEENT Neg resp Neg cardiac Neg GI Neg GU Neg MS Neg psych Neg neuro  Objective:   Vitals:   09/12/22 1007  BP: 120/78  Pulse: (!) 56  SpO2: 98%  Weight: 216 lb 6.4 oz (98.2 kg)  Height: 5\' 4"  (1.626 m)   Exam General appearance : Awake, alert, not in any distress. Speech Clear. Not toxic looking HEENT: Atraumatic and Normocephalic Neck: Supple, no JVD. No cervical lymphadenopathy.  Chest: Good air entry bilaterally, CTAB.  No rales/rhonchi/wheezing CVS: S1 S2 regular, no murmurs.  Extremities:  B/L Lower Ext shows no edema, both legs are warm to touch Neurology: Awake alert, and oriented X 3, CN II-XII intact, Non focal Skin: No Rash  Data Review Lab Results  Component Value Date   HGBA1C 5.5 07/23/2016   HGBA1C 5.7 (H) 04/07/2015    Assessment & Plan   1. Language barrier AMN 06/07/2015) interpreters used and additional time performing visit was required.   2. Mixed hyperlipidemia - Lipid panel - atorvastatin (LIPITOR) 10 MG tablet; Take 1 tablet (10 mg total) by mouth daily.  Dispense: 90 tablet; Refill: 3 - Comprehensive metabolic panel  3. Acquired hypothyroidism - levothyroxine (SYNTHROID) 125 MCG tablet; TAKE 1 TABLET (125 MCG TOTAL) BY MOUTH DAILY.  Dispense: 90 tablet; Refill: 3 - Comprehensive metabolic panel  4. High risk medication use - Comprehensive metabolic panel    Return in about 6 months (around 03/13/2023) for PCP for chronic conditions.  The patient was given clear instructions to go to ER or return to medical center if symptoms don't improve, worsen or new problems develop. The patient verbalized understanding. The patient was told to call to get lab results if they haven't heard anything in the next week.      03/15/2023, PA-C [facility] and [facility] [place], [place]   09/12/2022, 10:15 AM

## 2022-09-13 ENCOUNTER — Other Ambulatory Visit: Payer: Self-pay | Admitting: Physician Assistant

## 2022-09-13 DIAGNOSIS — E039 Hypothyroidism, unspecified: Secondary | ICD-10-CM

## 2022-09-13 LAB — LIPID PANEL
Chol/HDL Ratio: 3.1 ratio (ref 0.0–4.4)
Cholesterol, Total: 151 mg/dL (ref 100–199)
HDL: 48 mg/dL (ref >39)
LDL Chol Calc (NIH): 85 mg/dL (ref 0–99)
Triglycerides: 95 mg/dL (ref 0–149)
VLDL Cholesterol Cal: 18 mg/dL (ref 5–40)

## 2022-09-13 LAB — COMPREHENSIVE METABOLIC PANEL
ALT: 19 IU/L (ref 0–32)
AST: 21 IU/L (ref 0–40)
Albumin/Globulin Ratio: 1.8 (ref 1.2–2.2)
Albumin: 4.1 g/dL (ref 3.9–4.9)
Alkaline Phosphatase: 65 IU/L (ref 44–121)
BUN/Creatinine Ratio: 17 (ref 9–23)
BUN: 10 mg/dL (ref 6–24)
Bilirubin Total: 0.4 mg/dL (ref 0.0–1.2)
CO2: 21 mmol/L (ref 20–29)
Calcium: 8.8 mg/dL (ref 8.7–10.2)
Chloride: 108 mmol/L — ABNORMAL HIGH (ref 96–106)
Creatinine, Ser: 0.6 mg/dL (ref 0.57–1.00)
Globulin, Total: 2.3 g/dL (ref 1.5–4.5)
Glucose: 98 mg/dL (ref 70–99)
Potassium: 4.7 mmol/L (ref 3.5–5.2)
Sodium: 141 mmol/L (ref 134–144)
Total Protein: 6.4 g/dL (ref 6.0–8.5)
eGFR: 111 mL/min/{1.73_m2} (ref >59)

## 2022-09-15 LAB — THYROID PANEL WITH TSH
Free Thyroxine Index: 1.9 (ref 1.2–4.9)
T3 Uptake Ratio: 25 % (ref 24–39)
T4, Total: 7.6 ug/dL (ref 4.5–12.0)
TSH: 3.26 u[IU]/mL (ref 0.450–4.500)

## 2022-09-15 LAB — SPECIMEN STATUS REPORT

## 2022-09-19 ENCOUNTER — Other Ambulatory Visit: Payer: Self-pay

## 2022-09-25 ENCOUNTER — Other Ambulatory Visit: Payer: Self-pay | Admitting: Critical Care Medicine

## 2022-09-25 ENCOUNTER — Other Ambulatory Visit: Payer: Self-pay

## 2022-09-28 ENCOUNTER — Other Ambulatory Visit: Payer: Self-pay

## 2022-10-04 ENCOUNTER — Other Ambulatory Visit: Payer: Self-pay | Admitting: Critical Care Medicine

## 2022-10-04 ENCOUNTER — Other Ambulatory Visit: Payer: Self-pay

## 2022-10-05 ENCOUNTER — Other Ambulatory Visit: Payer: Self-pay

## 2022-10-05 ENCOUNTER — Telehealth: Payer: Self-pay | Admitting: Critical Care Medicine

## 2022-10-05 NOTE — Telephone Encounter (Signed)
Patient had a Rx for vit D wants a refill but its not in the chart..    Please advise.

## 2022-10-09 ENCOUNTER — Other Ambulatory Visit: Payer: Self-pay

## 2022-10-18 NOTE — Telephone Encounter (Signed)
Called patient and made her aware of last vit D levels being normal, I also let her know that it was taken off list. Patient verbalized understanding  and no question or concerns at this time.    Interpreter id [phone]

## 2023-01-04 ENCOUNTER — Other Ambulatory Visit: Payer: Self-pay

## 2023-01-15 ENCOUNTER — Ambulatory Visit: Payer: Self-pay | Admitting: Critical Care Medicine

## 2023-01-16 ENCOUNTER — Ambulatory Visit: Payer: Self-pay | Attending: Critical Care Medicine | Admitting: Physician Assistant

## 2023-01-16 ENCOUNTER — Encounter: Payer: Self-pay | Admitting: Physician Assistant

## 2023-01-16 VITALS — BP 116/75 | HR 60 | Wt 211.6 lb

## 2023-01-16 DIAGNOSIS — Z789 Other specified health status: Secondary | ICD-10-CM

## 2023-01-16 DIAGNOSIS — R5383 Other fatigue: Secondary | ICD-10-CM

## 2023-01-16 DIAGNOSIS — E039 Hypothyroidism, unspecified: Secondary | ICD-10-CM

## 2023-01-16 DIAGNOSIS — Z23 Encounter for immunization: Secondary | ICD-10-CM

## 2023-01-16 DIAGNOSIS — E782 Mixed hyperlipidemia: Secondary | ICD-10-CM

## 2023-01-16 NOTE — Progress Notes (Signed)
Patient ID: Sierra Novak, female   DOB: [birth year], 47 y.o.   MRN: [id number]   Sierra Novak, is a 47 y.o. female  [id number]:[id number]  [id number]:[id number]  DOB - 03-21-1976  Chief Complaint  Patient presents with   Medication Refill       Subjective:   Sierra Novak is a 47 y.o. female here today for  Check up.  Epic shows she has 2 remaining 90 RF on meds. She does report feeling fatigued since having the flu at the end of December.  No other issues or concerns today No problems updated.  ALLERGIES: Allergies  Allergen Reactions   Shrimp [Shellfish Allergy]     PAST MEDICAL HISTORY: Past Medical History:  Diagnosis Date   Complete miscarriage 01/14/2019   Thyroid disease     MEDICATIONS AT HOME: Prior to Admission medications   Medication Sig Start Date End Date Taking? Authorizing Provider  atorvastatin (LIPITOR) 10 MG tablet Take 1 tablet (10 mg total) by mouth daily. 09/12/22  Yes Darcey Demma, Dionne Bucy, PA-C  levothyroxine (SYNTHROID) 125 MCG tablet TAKE 1 TABLET (125 MCG TOTAL) BY MOUTH DAILY. 09/12/22 09/12/23 Yes Paz Winsett, Dionne Bucy, PA-C  cetirizine (ZYRTEC) 10 MG tablet Take 1 tablet (10 mg total) by mouth daily. Patient not taking: Reported on 08/15/2022 02/07/22   Elsie Stain, MD  fluticasone [facility]) 50 MCG/ACT nasal spray Place 2 sprays into both nostrils daily. Patient not taking: Reported on 08/15/2022 02/07/22   Elsie Stain, MD    ROS: Neg HEENT Neg resp Neg cardiac Neg GI Neg GU Neg MS Neg psych Neg neuro  Objective:   Vitals:   01/16/23 1024  BP: 116/75  Pulse: 60  SpO2: 97%  Weight: 211 lb 9.6 oz (96 kg)   Exam General appearance : Awake, alert, not in any distress. Speech Clear. Not toxic looking HEENT: Atraumatic and Normocephalic Neck: Supple, no JVD. No cervical lymphadenopathy.  Chest: Good air entry bilaterally, CTAB.  No rales/rhonchi/wheezing CVS: S1 S2 regular, no murmurs.  Extremities: B/L Lower Ext shows  no edema, both legs are warm to touch Neurology: Awake alert, and oriented X 3, CN II-XII intact, Non focal Skin: No Rash  Data Review Lab Results  Component Value Date   HGBA1C 5.5 07/23/2016   HGBA1C 5.7 (H) 04/07/2015    Assessment & Plan   1. Need for immunization against influenza - Flu Vaccine QUAD 98mo+IM (Fluarix, Fluzone & Alfiuria Quad PF)  2. Acquired hypothyroidism Please RF all meds for 6 months (2 of 3 showing as remaining) - Thyroid Panel With TSH Continue synthroid 125 mcg.  I will notify if dose needs to change after labs  3. Language barrier AMN "Effie Shy" interpreters used and additional time performing visit was required.   4. Mixed hyperlipidemia Continue atorvastatin 10mg  daily.  Controlled at last visit with normal LFT  5. Fatigue, unspecified type - Thyroid Panel With TSH - CBC with Differential/Platelet    Return in about 6 months (around 07/17/2023) for PCP for chronic conditions.  The patient was given clear instructions to go to ER or return to medical center if symptoms don't improve, worsen or new problems develop. The patient verbalized understanding. The patient was told to call to get lab results if they haven't heard anything in the next week.      Freeman Caldron, PA-C [facility] and [facility] [place], [place]   01/16/2023, 10:43 AM

## 2023-01-17 ENCOUNTER — Other Ambulatory Visit: Payer: Self-pay

## 2023-01-17 ENCOUNTER — Other Ambulatory Visit: Payer: Self-pay | Admitting: Physician Assistant

## 2023-01-17 LAB — CBC WITH DIFFERENTIAL/PLATELET
Basophils Absolute: 0 10*3/uL (ref 0.0–0.2)
Basos: 0 %
EOS (ABSOLUTE): 0.1 10*3/uL (ref 0.0–0.4)
Eos: 2 %
Hematocrit: 41 % (ref 34.0–46.6)
Hemoglobin: 13.5 g/dL (ref 11.1–15.9)
Immature Grans (Abs): 0 10*3/uL (ref 0.0–0.1)
Immature Granulocytes: 0 %
Lymphocytes Absolute: 2.1 10*3/uL (ref 0.7–3.1)
Lymphs: 47 %
MCH: 29.8 pg (ref 26.6–33.0)
MCHC: 32.9 g/dL (ref 31.5–35.7)
MCV: 91 fL (ref 79–97)
Monocytes Absolute: 0.4 10*3/uL (ref 0.1–0.9)
Monocytes: 8 %
Neutrophils Absolute: 1.9 10*3/uL (ref 1.4–7.0)
Neutrophils: 43 %
Platelets: 345 10*3/uL (ref 150–450)
RBC: 4.53 x10E6/uL (ref 3.77–5.28)
RDW: 12.8 % (ref 11.7–15.4)
WBC: 4.5 10*3/uL (ref 3.4–10.8)

## 2023-01-17 LAB — THYROID PANEL WITH TSH
Free Thyroxine Index: 4.1 (ref 1.2–4.9)
T3 Uptake Ratio: 33 % (ref 24–39)
T4, Total: 12.5 ug/dL — ABNORMAL HIGH (ref 4.5–12.0)
TSH: 0.096 u[IU]/mL — ABNORMAL LOW (ref 0.450–4.500)

## 2023-01-17 MED ORDER — LEVOTHYROXINE SODIUM 100 MCG PO TABS
100.0000 ug | ORAL_TABLET | Freq: Every day | ORAL | 1 refills | Status: DC
  Filled 2023-01-17: qty 90, 90d supply, fill #0
  Filled 2023-05-15: qty 90, 90d supply, fill #1

## 2023-01-18 ENCOUNTER — Telehealth: Payer: Self-pay

## 2023-01-18 NOTE — Telephone Encounter (Signed)
-----  Message from Carilyn Goodpasture, RN sent at 01/17/2023  5:42 PM EST -----  ----- Message ----- From: Mathis Dad Sent: 01/17/2023   8:35 AM EST To: Carilyn Goodpasture, RN  Please call patient.  Her thyroid medication dose is too high for now.  I have sent her a new prescription of levothyroxine 168mcg for her to take daily.  She should stop the 15mcg dose and pick up the new prescription.  Blood count is normal-no anemia.  Thanks, Freeman Caldron, PA-C

## 2023-01-18 NOTE — Telephone Encounter (Signed)
Called patient unable to make contact or leave voicemail   Interpreter id# Lowella Dandy [phone]

## 2023-02-20 ENCOUNTER — Ambulatory Visit: Payer: Self-pay | Attending: Critical Care Medicine

## 2023-03-13 ENCOUNTER — Ambulatory Visit: Payer: Self-pay | Admitting: Critical Care Medicine

## 2023-04-09 ENCOUNTER — Telehealth: Payer: Self-pay | Admitting: Critical Care Medicine

## 2023-04-09 NOTE — Telephone Encounter (Unsigned)
Copied from CRM [id number]. Topic: General - Other >> Apr 09, 2023  4:44 PM Marlow Baars wrote: Reason for CRM: The patient is wanting to make an appointment for financial assistance with a counselor and needs help applying for the orange card. Please assist patient further.

## 2023-04-11 NOTE — Telephone Encounter (Signed)
Patient is calling back again regarding financial assistance  Please advise

## 2023-04-11 NOTE — Telephone Encounter (Signed)
Routing to Firefighter

## 2023-04-25 ENCOUNTER — Ambulatory Visit: Payer: Self-pay | Attending: Critical Care Medicine

## 2023-05-15 ENCOUNTER — Other Ambulatory Visit: Payer: Self-pay

## 2023-07-17 ENCOUNTER — Other Ambulatory Visit: Payer: Self-pay

## 2023-07-17 ENCOUNTER — Ambulatory Visit: Payer: Self-pay | Attending: Critical Care Medicine | Admitting: Critical Care Medicine

## 2023-07-17 ENCOUNTER — Encounter: Payer: Self-pay | Admitting: Critical Care Medicine

## 2023-07-17 ENCOUNTER — Other Ambulatory Visit: Payer: Self-pay | Admitting: Critical Care Medicine

## 2023-07-17 VITALS — BP 112/68 | HR 62 | Temp 97.7°F | Ht 64.0 in | Wt 211.0 lb

## 2023-07-17 DIAGNOSIS — E039 Hypothyroidism, unspecified: Secondary | ICD-10-CM

## 2023-07-17 DIAGNOSIS — E782 Mixed hyperlipidemia: Secondary | ICD-10-CM

## 2023-07-17 DIAGNOSIS — E559 Vitamin D deficiency, unspecified: Secondary | ICD-10-CM

## 2023-07-17 DIAGNOSIS — Z139 Encounter for screening, unspecified: Secondary | ICD-10-CM

## 2023-07-17 DIAGNOSIS — Z6841 Body Mass Index (BMI) 40.0 and over, adult: Secondary | ICD-10-CM

## 2023-07-17 DIAGNOSIS — R5383 Other fatigue: Secondary | ICD-10-CM

## 2023-07-17 MED ORDER — ATORVASTATIN CALCIUM 10 MG PO TABS
10.0000 mg | ORAL_TABLET | Freq: Every day | ORAL | 3 refills | Status: DC
  Filled 2023-07-17 – 2023-07-26 (×2): qty 90, 90d supply, fill #0
  Filled 2023-10-18: qty 90, 90d supply, fill #1
  Filled 2024-02-26: qty 90, 90d supply, fill #2
  Filled 2024-05-22: qty 90, 90d supply, fill #3

## 2023-07-17 MED ORDER — LEVOTHYROXINE SODIUM 100 MCG PO TABS
100.0000 ug | ORAL_TABLET | Freq: Every day | ORAL | 1 refills | Status: DC
  Filled 2023-07-17 – 2023-10-07 (×2): qty 90, 90d supply, fill #0
  Filled 2024-01-07: qty 90, 90d supply, fill #1

## 2023-07-17 NOTE — Patient Instructions (Signed)
Labs today Refills on medications Return 6 months will call results   Follow lifestyle approach see handout

## 2023-07-17 NOTE — Assessment & Plan Note (Signed)
Follow-up lipids gave patient lifestyle medicine approach refill atorvastatin

## 2023-07-17 NOTE — Assessment & Plan Note (Signed)
The following Lifestyle Medicine recommendations according to American College of Lifestyle Medicine (ACLM) were discussed and offered to patient who agrees to start the journey:  A. Whole Foods, Plant-based plate comprising of fruits and vegetables, plant-based proteins, whole-grain carbohydrates was discussed in detail with the patient.   A list for source of those nutrients were also provided to the patient.  Patient will use only water or unsweetened tea for hydration. B.  The need to stay away from risky substances including alcohol, smoking; obtaining 7 to 9 hours of restorative sleep, at least 150 minutes of moderate intensity exercise weekly, the importance of healthy social connections,  and stress reduction techniques were discussed. C.  A full color page of  Calorie density of various food groups per pound showing examples of each food groups was provided to the patient.  

## 2023-07-17 NOTE — Assessment & Plan Note (Signed)
Reassess thyroid function ?

## 2023-07-17 NOTE — Assessment & Plan Note (Signed)
Reassess vitamin D levels along with B12

## 2023-07-17 NOTE — Progress Notes (Signed)
Established Patient Office Visit  Subjective   Patient ID: Sierra Novak, female    DOB: [birth year]  Age: 47 y.o. MRN: [id number]  Chief Complaint  Patient presents with   Hypothyroidism    Hypothyroidism f/u. Med refill.  Experiencing hot flashes, fatigue - requesting blood work     08/15/22 Sierra Novak presents today for follow up. She has history of hypothyroidism, hyperlipidemia and seasonal allergic rhinitis. At the previous visit she was in the process of obtaining the orange card insurance. She needed to follow up with her dentist regarding her jaw and teeth pain. She has done so and had a tooth filling done earlier today.  At the last visit, she had a lipid panel completed. She was then placed on 10 mg of atorvastatin. She has been taking this daily.  She does have seasonal allergies, asymptomatic at this time. She uses zyrtec and flonase as needed.  She has history of hypothyroidism, currently on thyroxine daily. Levels were checked at our previous visit and were all within normal range.  She does not have an eye doctor and states she feels her vision is becoming blurry over time.  She has no acute complaints today, is feeling well. She is interested in getting a Papsmear, as her last one was about 3 years ago. Denies any urinary/vaginal, abdominal or general symptoms. Does note some intermittent hotflashes with sweating.   History obtained and visit completed via Spanish interpreter Sierra Novak #[id number]   07/17/23 The patient is seen in return follow-up Spanish interpreter used for this visit Sierra Novak #[id number].  Patient's chief complaint is that of fatigue and hot flashes.  She still having regular menstrual periods and no excess bleeding.  At the last visit in January she had elevated thyroid function her dose of levothyroxine was reduced to 100 mcg daily.  Thyroid function needs to be followed up.  She is requesting labs to evaluate fatigue.  Weight remains  elevated.  Blood pressure is normal. 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   Mixed hyperlipidemia 02/07/2022   Allergic rhinitis 05/08/2021   BMI 40.0-44.9, adult (HCC) 05/08/2021   Chronic dental pain 03/07/2021   Acquired hypothyroidism 11/07/2020   Vitamin D deficiency 09/28/2014   Past Medical History:  Diagnosis Date   Complete miscarriage 01/14/2019   Thyroid disease    Past Surgical History:  Procedure Laterality Date   CESAREAN SECTION     CHOLECYSTECTOMY     Social History   Tobacco Use   Smoking status: Never   Smokeless tobacco: Never  Substance Use Topics   Alcohol use: No    Alcohol/week: 0.0 standard drinks of alcohol   Drug use: No   Family History  Problem Relation Age of Onset   Hypertension Father    Diabetes Paternal Aunt    Allergies  Allergen Reactions   Shrimp [Shellfish Allergy]       Review of Systems  Constitutional:  Positive for malaise/fatigue. Negative for fever and weight loss.  HENT:  Negative for congestion and sore throat.   Eyes:  Negative for blurred vision and pain.  Respiratory:  Negative for cough and wheezing.   Cardiovascular:  Negative for chest pain and palpitations.  Gastrointestinal:  Negative for constipation, diarrhea, nausea and vomiting.  Genitourinary:  Negative for frequency and urgency.  Musculoskeletal:  Negative for falls and neck pain.  Skin:  Negative for itching and rash.  Neurological:  Negative for weakness.       Hot flashes   Endo/Heme/Allergies:  Positive for environmental allergies.  Psychiatric/Behavioral:  Negative for suicidal ideas.       Objective:     BP 112/68 (BP Location: Left Arm, Patient Position: Sitting, Cuff Size: Normal)   Pulse 62   Temp 97.7 F (36.5 C) (Oral)   Ht 5\' 4"  (1.626 m)   Wt 211 lb (95.7 kg)   SpO2  98%   BMI 36.22 kg/m     Physical Exam Constitutional:      Appearance: Normal appearance. She is obese.  HENT:     Right Ear: Tympanic membrane, ear canal and external ear normal.     Left Ear: Tympanic membrane, ear canal and external ear normal.     Nose: Nose normal.     Mouth/Throat:     Mouth: Mucous membranes are moist.     Pharynx: Oropharynx is clear.  Eyes:     Conjunctiva/sclera: Conjunctivae normal.  Cardiovascular:     Rate and Rhythm: Normal rate and regular rhythm.     Pulses: Normal pulses.     Heart sounds: Normal heart sounds.  Pulmonary:     Effort: Pulmonary effort is normal.     Breath sounds: Normal breath sounds.  Abdominal:     General: Bowel sounds are normal.     Palpations: Abdomen is soft.     Tenderness: There is no abdominal tenderness.  Musculoskeletal:     Cervical back: Normal range of motion and neck supple.  Skin:    General: Skin is warm.     Capillary Refill: Capillary refill takes less than 2 seconds.  Neurological:     General: No focal deficit present.     Mental Status: She is alert and oriented to person, place, and time.  Psychiatric:        Mood and Affect: Mood normal.        Behavior: Behavior normal.      No results found for any visits on 07/17/23.     The 10-year ASCVD risk score (Arnett DK, et al., 2019) is: 0.6%    Assessment & Plan:   Problem List Items Addressed This Visit       Endocrine   Acquired hypothyroidism - Primary    Reassess thyroid function      Relevant Medications   levothyroxine (SYNTHROID) 100 MCG tablet   Other Relevant Orders   Thyroid Panel With TSH     Other   Vitamin D deficiency    Reassess vitamin D levels along with B12      Relevant Orders   VITAMIN D 25 Hydroxy (Vit-D Deficiency, Fractures)   BMI 40.0-44.9, adult [facility])    The following Lifestyle Medicine recommendations according to American College of Lifestyle Medicine [facility]) were discussed and offered to patient  who agrees to start the journey:  A. Whole Foods, Plant-based plate comprising of fruits and vegetables, plant-based proteins, whole-grain carbohydrates was discussed in detail with the patient.   A list for source of those nutrients were also provided to the patient.  Patient will use only water or unsweetened tea for hydration. B.  The need to stay away from risky substances including alcohol, smoking; obtaining 7 to 9 hours of restorative sleep, at least 150 minutes of moderate intensity exercise weekly, the importance of healthy social connections,  and stress reduction techniques were discussed. C.  A full color page of  Calorie density of various food groups per pound showing examples of each food groups was provided to the patient.       Mixed hyperlipidemia    Follow-up lipids gave patient lifestyle medicine approach refill atorvastatin      Relevant Medications   atorvastatin (LIPITOR) 10 MG tablet   Other Relevant Orders   Lipid panel   Other fatigue   Relevant Orders   Comprehensive metabolic panel   CBC with Differential/Platelet   Iron, TIBC and Ferritin Panel   Vitamin B12   Other Visit Diagnoses     Encounter for health-related screening            Return in about 6 months (around 01/17/2024) for thyroid, primary care follow up.    Shan Levans, MD

## 2023-07-18 ENCOUNTER — Other Ambulatory Visit: Payer: Self-pay | Admitting: Critical Care Medicine

## 2023-07-18 ENCOUNTER — Telehealth: Payer: Self-pay

## 2023-07-18 LAB — COMPREHENSIVE METABOLIC PANEL
ALT: 21 IU/L (ref 0–32)
AST: 22 IU/L (ref 0–40)
Albumin: 4.1 g/dL (ref 3.9–4.9)
Alkaline Phosphatase: 66 IU/L (ref 44–121)
BUN/Creatinine Ratio: 20 (ref 9–23)
BUN: 11 mg/dL (ref 6–24)
Bilirubin Total: 0.6 mg/dL (ref 0.0–1.2)
CO2: 20 mmol/L (ref 20–29)
Calcium: 9.3 mg/dL (ref 8.7–10.2)
Chloride: 104 mmol/L (ref 96–106)
Creatinine, Ser: 0.55 mg/dL — ABNORMAL LOW (ref 0.57–1.00)
Globulin, Total: 2.4 g/dL (ref 1.5–4.5)
Glucose: 94 mg/dL (ref 70–99)
Potassium: 4.4 mmol/L (ref 3.5–5.2)
Sodium: 137 mmol/L (ref 134–144)
Total Protein: 6.5 g/dL (ref 6.0–8.5)
eGFR: 113 mL/min/{1.73_m2} (ref >59)

## 2023-07-18 LAB — CBC WITH DIFFERENTIAL/PLATELET
Basophils Absolute: 0 10*3/uL (ref 0.0–0.2)
Basos: 0 %
EOS (ABSOLUTE): 0.2 10*3/uL (ref 0.0–0.4)
Eos: 3 %
Hematocrit: 39.2 % (ref 34.0–46.6)
Hemoglobin: 12.6 g/dL (ref 11.1–15.9)
Immature Grans (Abs): 0 10*3/uL (ref 0.0–0.1)
Immature Granulocytes: 0 %
Lymphocytes Absolute: 2 10*3/uL (ref 0.7–3.1)
Lymphs: 37 %
MCH: 29.6 pg (ref 26.6–33.0)
MCHC: 32.1 g/dL (ref 31.5–35.7)
MCV: 92 fL (ref 79–97)
Monocytes Absolute: 0.4 10*3/uL (ref 0.1–0.9)
Monocytes: 7 %
Neutrophils Absolute: 2.8 10*3/uL (ref 1.4–7.0)
Neutrophils: 53 %
Platelets: 308 10*3/uL (ref 150–450)
RBC: 4.25 x10E6/uL (ref 3.77–5.28)
RDW: 13.1 % (ref 11.7–15.4)
WBC: 5.3 10*3/uL (ref 3.4–10.8)

## 2023-07-18 LAB — THYROID PANEL WITH TSH
Free Thyroxine Index: 2.3 (ref 1.2–4.9)
T3 Uptake Ratio: 27 % (ref 24–39)
T4, Total: 8.7 ug/dL (ref 4.5–12.0)
TSH: 3.17 u[IU]/mL (ref 0.450–4.500)

## 2023-07-18 LAB — IRON,TIBC AND FERRITIN PANEL
Ferritin: 12 ng/mL — ABNORMAL LOW (ref 15–150)
Iron Saturation: 22 % (ref 15–55)
Iron: 91 ug/dL (ref 27–159)
Total Iron Binding Capacity: 423 ug/dL (ref 250–450)
UIBC: 332 ug/dL (ref 131–425)

## 2023-07-18 LAB — VITAMIN D 25 HYDROXY (VIT D DEFICIENCY, FRACTURES): Vit D, 25-Hydroxy: 20.2 ng/mL — ABNORMAL LOW (ref 30.0–100.0)

## 2023-07-18 LAB — LIPID PANEL
Chol/HDL Ratio: 2.7 ratio (ref 0.0–4.4)
Cholesterol, Total: 155 mg/dL (ref 100–199)
HDL: 57 mg/dL (ref >39)
LDL Chol Calc (NIH): 84 mg/dL (ref 0–99)
Triglycerides: 70 mg/dL (ref 0–149)
VLDL Cholesterol Cal: 14 mg/dL (ref 5–40)

## 2023-07-18 LAB — VITAMIN B12: Vitamin B-12: 796 pg/mL (ref 232–1245)

## 2023-07-18 MED ORDER — VITAMIN D (ERGOCALCIFEROL) 1.25 MG (50000 UNIT) PO CAPS
50000.0000 [IU] | ORAL_CAPSULE | ORAL | 5 refills | Status: DC
  Filled 2023-07-26: qty 12, 84d supply, fill #0
  Filled 2023-10-18: qty 12, 84d supply, fill #1
  Filled 2024-01-07: qty 12, 84d supply, fill #2
  Filled 2024-03-30: qty 12, 84d supply, fill #3
  Filled 2024-06-29: qty 12, 84d supply, fill #4

## 2023-07-18 NOTE — Progress Notes (Signed)
Let pt know kidney liver normal, vit D very low I sent Rx for a vit d pill take weekly Iron level normal, cholesterol is good, thyroid function normal no change in thyroid medication dose Blood count normal, B12 normal

## 2023-07-18 NOTE — Telephone Encounter (Signed)
Pt was called and vm was left, Information has been sent to nurse pool.   Interpreter id [phone]

## 2023-07-18 NOTE — Telephone Encounter (Signed)
-----   Message from Sierra Novak sent at 07/18/2023  6:04 AM EDT ----- Let pt know kidney liver normal, vit D very low I sent Rx for a vit d pill take weekly Iron level normal, cholesterol is good, thyroid function normal no change in thyroid medication dose Blood count normal, B12 normal

## 2023-07-23 ENCOUNTER — Other Ambulatory Visit: Payer: Self-pay

## 2023-07-26 ENCOUNTER — Other Ambulatory Visit: Payer: Self-pay

## 2023-10-07 ENCOUNTER — Other Ambulatory Visit: Payer: Self-pay

## 2023-10-14 ENCOUNTER — Encounter: Payer: Self-pay | Admitting: Family Medicine

## 2023-10-14 ENCOUNTER — Other Ambulatory Visit: Payer: Self-pay

## 2023-10-14 ENCOUNTER — Ambulatory Visit: Payer: Self-pay | Attending: Family Medicine | Admitting: Family Medicine

## 2023-10-14 VITALS — BP 122/81 | HR 64 | Ht 64.0 in | Wt 213.0 lb

## 2023-10-14 DIAGNOSIS — Z23 Encounter for immunization: Secondary | ICD-10-CM

## 2023-10-14 DIAGNOSIS — B372 Candidiasis of skin and nail: Secondary | ICD-10-CM

## 2023-10-14 MED ORDER — NYSTATIN 100000 UNIT/ML MT SUSP
5.0000 mL | Freq: Four times a day (QID) | OROMUCOSAL | 1 refills | Status: AC
  Filled 2023-10-14: qty 60, 3d supply, fill #0
  Filled 2023-10-18: qty 60, 3d supply, fill #1

## 2023-10-14 NOTE — Progress Notes (Signed)
Subjective:  Patient ID: Sierra Novak, female    DOB: [birth year]  Age: 47 y.o. MRN: [id number]  CC: Rash (Rash on inner thigh)   HPI Sierra Novak is a 47 y.o. year old female with a history of hypothyroidism, hyperlipidemia here for an office visit.  Interval History: Discussed the use of AI scribe software for clinical note transcription with the patient, who gave verbal consent to proceed.  She presents for evaluation and treatment of a rash. The rash, which started on the upper thighs about three months ago, was initially dry and very itchy. The patient self-treated with a "1% OTC cream", which provided some relief. However, the itchiness intensified after the patient scratched the area excessively. The patient also used a spray, which helped to reduce the itchiness and the redness of the rash. The patient denies any involvement of the private areas. The patient also reports excessive sweating.       Past Medical History:  Diagnosis Date   Complete miscarriage 01/14/2019   Thyroid disease     Past Surgical History:  Procedure Laterality Date   CESAREAN SECTION     CHOLECYSTECTOMY      Family History  Problem Relation Age of Onset   Hypertension Father    Diabetes Paternal Aunt     Social History   Socioeconomic History   Marital status: Legally Separated    Spouse name: Not on file   Number of children: Not on file   Years of education: Not on file   Highest education level: Not on file  Occupational History   Not on file  Tobacco Use   Smoking status: Never   Smokeless tobacco: Never  Substance and Sexual Activity   Alcohol use: No    Alcohol/week: 0.0 standard drinks of alcohol   Drug use: No   Sexual activity: Yes    Comment: 1ST INTERCOURSE- 42, PARTNERS- 1  Other Topics Concern   Not on file  Social History Narrative   ** Merged History Encounter **       Social Determinants of Health   Financial Resource Strain: Not on file   Food Insecurity: Not on file  Transportation Needs: Not on file  Physical Activity: Not on file  Stress: Not on file  Social Connections: Not on file    Allergies  Allergen Reactions   Shrimp [Shellfish Allergy]     Outpatient Medications Prior to Visit  Medication Sig Dispense Refill   atorvastatin (LIPITOR) 10 MG tablet Take 1 tablet (10 mg total) by mouth daily. 90 tablet 3   levothyroxine (SYNTHROID) 100 MCG tablet Take 1 tablet (100 mcg total) by mouth daily. (Stop ) 90 tablet 1   Vitamin D, Ergocalciferol, (DRISDOL) 1.25 MG (50000 UNIT) CAPS capsule Take 1 capsule (50,000 Units total) by mouth every 7 (seven) days. 14 capsule 5   No facility-administered medications prior to visit.     ROS Review of Systems  Constitutional:  Negative for activity change and appetite change.  HENT:  Negative for sinus pressure and sore throat.   Respiratory:  Negative for chest tightness, shortness of breath and wheezing.   Cardiovascular:  Negative for chest pain and palpitations.  Gastrointestinal:  Negative for abdominal distention, abdominal pain and constipation.  Genitourinary: Negative.   Musculoskeletal: Negative.   Skin:  Positive for rash.  Psychiatric/Behavioral:  Negative for behavioral problems and dysphoric mood.     Objective:  BP 122/81   Pulse 64   Ht 5'  4" (1.626 m)   Wt 213 lb (96.6 kg)   SpO2 100%   BMI 36.56 kg/m      10/14/2023    9:24 AM 07/17/2023    9:33 AM 01/16/2023   10:24 AM  BP/Weight  Systolic BP 122 112 116  Diastolic BP 81 68 75  Wt. (Lbs) 213 211 211.6  BMI 36.56 kg/m2 36.22 kg/m2 36.32 kg/m2      Physical Exam Constitutional:      Appearance: She is well-developed.  Cardiovascular:     Rate and Rhythm: Normal rate.     Heart sounds: Normal heart sounds. No murmur heard. Pulmonary:     Effort: Pulmonary effort is normal.     Breath sounds: Normal breath sounds. No wheezing or rales.  Chest:     Chest wall: No tenderness.   Abdominal:     General: Bowel sounds are normal. There is no distension.     Palpations: Abdomen is soft. There is no mass.     Tenderness: There is no abdominal tenderness.  Musculoskeletal:        General: Normal range of motion.     Right lower leg: No edema.     Left lower leg: No edema.  Skin:    Comments: Erythematous rash and medial aspects of bilateral upper thighs with pinpoint lesions  Neurological:     Mental Status: She is alert and oriented to person, place, and time.  Psychiatric:        Mood and Affect: Mood normal.        Latest Ref Rng & Units 07/17/2023   10:13 AM 09/12/2022   10:23 AM 02/07/2022    9:12 AM  CMP  Glucose 70 - 99 mg/dL 94  98  98   BUN 6 - 24 mg/dL 11  10  9    Creatinine 0.57 - 1.00 mg/dL 1.30  8.65  7.84   Sodium 134 - 144 mmol/L 137  141  137   Potassium 3.5 - 5.2 mmol/L 4.4  4.7  4.9   Chloride 96 - 106 mmol/L 104  108  104   CO2 20 - 29 mmol/L 20  21  23    Calcium 8.7 - 10.2 mg/dL 9.3  8.8  9.5   Total Protein 6.0 - 8.5 g/dL 6.5  6.4  6.8   Total Bilirubin 0.0 - 1.2 mg/dL 0.6  0.4  0.5   Alkaline Phos 44 - 121 IU/L 66  65  68   AST 0 - 40 IU/L 22  21  21    ALT 0 - 32 IU/L 21  19  16      Lipid Panel     Component Value Date/Time   CHOL 155 07/17/2023 1013   TRIG 70 07/17/2023 1013   HDL 57 07/17/2023 1013   CHOLHDL 2.7 07/17/2023 1013   CHOLHDL 4.0 03/22/2014 1121   VLDL 23 03/22/2014 1121   LDLCALC 84 07/17/2023 1013    CBC    Component Value Date/Time   WBC 5.3 07/17/2023 1013   WBC 6.4 01/13/2019 1224   RBC 4.25 07/17/2023 1013   RBC 4.16 01/13/2019 1224   HGB 12.6 07/17/2023 1013   HCT 39.2 07/17/2023 1013   PLT 308 07/17/2023 1013   MCV 92 07/17/2023 1013   MCH 29.6 07/17/2023 1013   MCH 31.5 01/13/2019 1224   MCHC 32.1 07/17/2023 1013   MCHC 33.4 01/13/2019 1224   RDW 13.1 07/17/2023 1013   LYMPHSABS 2.0 07/17/2023 1013  MONOABS 0.7 01/13/2019 1224   EOSABS 0.2 07/17/2023 1013   BASOSABS 0.0 07/17/2023 1013     Lab Results  Component Value Date   HGBA1C 5.5 07/23/2016    Lab Results  Component Value Date   TSH 3.170 07/17/2023    Assessment & Plan:      Candidal intertrigo Itchy rash on the leg, likely due to skin-on-skin friction and moisture, leading to a fungal infection. The patient has been self-treating with a 1% cream and a spray, which has provided some relief. -Prescribe nystatin -Advise patient to wear loose clothing to allow air circulation.  Influenza Vaccination Patient requested the flu vaccine. -Administer flu vaccine today.          Meds ordered this encounter  Medications   nystatin (MYCOSTATIN) 100000 UNIT/ML suspension    Sig: Take 5 mLs (500,000 Units total) by mouth 4 (four) times daily.    Dispense:  60 mL    Refill:  1    Follow-up: Return if symptoms worsen or fail to improve.       Hoy Register, MD, FAAFP. [facility] [place], [place] [phone]   10/14/2023, 11:01 AM

## 2023-10-14 NOTE — Patient Instructions (Addendum)
Intertrigo Intertrigo El intertrigo es una irritacin de la piel (inflamacin) que ocurre en las zonas calientes y hmedas del cuerpo. La irritacin puede causar una erupcin cutnea, y que la piel quede en carne viva y con picazn. La erupcin generalmente es rosa o roja. Ocurre con mayor frecuencia [employer] pliegues de la piel o donde la piel se roza con piel, como por ejemplo: En las [place]. Debajo de las mamas. Debajo del vientre. En la zona de la ingle. Alrededor de la zona de las nalgas. Entre los dedos de los pies. Esta afeccin no se transmite de [place] persona a otra. Cules son las causas? El calor, la humedad, el roce y la falta de buena circulacin de [place]. Esta afeccin puede empeorar debido a lo siguiente: Transpiracin. Bacterias. Un hongo, como las levaduras. Qu incrementa el riesgo? La humedad en los pliegues de la piel. Es ms probable que tenga esta afeccin si: No puede moverse. Vive en un rea de clima clido y hmedo. No puede controlar el pis (orina) o la materia fecal (heces). [place] frulas, dispositivos ortopdicos u otros dispositivos mdicos. Tiene sobrepeso. Tiene diabetes. Cules son los signos o sntomas? Una erupcin cutnea rosa o roja en un pliegue de la piel o cerca de un pliegue de la piel. Piel en carne viva o escamosa. Picazn. Sensacin de ardor. Sangrado. Secrecin de lquido. Mal olor. Cmo se trata? Mantener la piel limpia y seca. Tomar un antibitico o usar una crema antibitica en la piel para combatir una infeccin bacteriana. Usar una crema antimictica en la piel o tomar pldoras para tratar una infeccin causada por hongos, como levaduras. Usar una pomada con corticoesteroides para Psychologist, sport and exercise y Youth worker. Separar el pliegue de la piel con un pao de algodn limpio para que absorba la humedad y permita que circule aire por la zona. Siga estas instrucciones en su casa: Mantenga la zona afectada limpia y seca. No se  rasque la piel. Permanezca fresco todo lo que pueda. Si tiene, use un aire acondicionado o IT consultant. Aplquese los medicamentos de venta libre y los recetados solamente como se lo haya indicado el mdico. Si le recetaron antibiticos, tmelos como se lo haya indicado el mdico. No deje de usar el antibitico aunque comience a Actor. Concurra a todas las visitas de seguimiento. Es posible que su mdico deba examinarle la piel para asegurarse de que el tratamiento est funcionando. Cmo se previene? Despus de realizar [place], dchese y squese bien. Use un secador de cabello con aire fro para secar la [employer]. No use ropa ajustada. Use prendas de vestir que: Sean sueltas. Absorban la humedad del cuerpo. Sean de algodn. Use un sostn que proporcione buen soporte, si es necesario. Proteja la piel de la ingle y de la zona de los glteos como se lo haya indicado el mdico. Para hacer esto: Normajean Baxter rutina de limpieza regular. Use cremas, polvos o pomadas que protejan la piel. Cmbiese los apsitos protectores con frecuencia. Mantenga un peso saludable. Cudese los pies. Esto es muy importante si tiene diabetes. Deber: Use zapatos que calcen bien. Mantener los pies secos. Usar calcetines limpios de algodn o lana. Si tiene diabetes, mantenga bajo control el nivel de Banker. Comunquese con un mdico si: Los sntomas no mejoran con Scientist, research (medical). Los sntomas empeoran o se extienden a Corporate treasurer del cuerpo. Nota ms enrojecimiento y calor. Tiene fiebre. Esta informacin no tiene Theme park manager el consejo del mdico.  Asegrese de hacerle al mdico cualquier pregunta que tenga. Document Revised: 05/30/2022 Document Reviewed: 05/30/2022 Elsevier Patient Education  2024 [employer].

## 2023-10-18 ENCOUNTER — Other Ambulatory Visit: Payer: Self-pay

## 2023-10-25 ENCOUNTER — Other Ambulatory Visit: Payer: Self-pay

## 2023-10-25 ENCOUNTER — Emergency Department (HOSPITAL_COMMUNITY)
Admission: EM | Admit: 2023-10-25 | Discharge: 2023-10-26 | Disposition: A | Discharge status: Home / Self Care | Payer: Self-pay | Attending: Emergency Medicine | Admitting: Emergency Medicine

## 2023-10-25 DIAGNOSIS — M26621 Arthralgia of right temporomandibular joint: Secondary | ICD-10-CM | POA: Insufficient documentation

## 2023-10-25 DIAGNOSIS — M26609 Unspecified temporomandibular joint disorder, unspecified side: Secondary | ICD-10-CM

## 2023-10-25 LAB — COMPREHENSIVE METABOLIC PANEL
ALT: 24 U/L (ref 0–44)
AST: 26 U/L (ref 15–41)
Albumin: 3.6 g/dL (ref 3.5–5.0)
Alkaline Phosphatase: 55 U/L (ref 38–126)
Anion gap: 10 (ref 5–15)
BUN: 17 mg/dL (ref 6–20)
CO2: 23 mmol/L (ref 22–32)
Calcium: 9.6 mg/dL (ref 8.9–10.3)
Chloride: 106 mmol/L (ref 98–111)
Creatinine, Ser: 0.62 mg/dL (ref 0.44–1.00)
GFR, Estimated: >60 mL/min (ref >60)
Glucose, Bld: 107 mg/dL — ABNORMAL HIGH (ref 70–99)
Potassium: 3.8 mmol/L (ref 3.5–5.1)
Sodium: 139 mmol/L (ref 135–145)
Total Bilirubin: 0.3 mg/dL (ref 0.3–1.2)
Total Protein: 6.4 g/dL — ABNORMAL LOW (ref 6.5–8.1)

## 2023-10-25 LAB — CBC WITH DIFFERENTIAL/PLATELET
Abs Immature Granulocytes: 0.01 10*3/uL (ref 0.00–0.07)
Basophils Absolute: 0 10*3/uL (ref 0.0–0.1)
Basophils Relative: 0 %
Eosinophils Absolute: 0.2 10*3/uL (ref 0.0–0.5)
Eosinophils Relative: 3 %
HCT: 36.2 % (ref 36.0–46.0)
Hemoglobin: 11.8 g/dL — ABNORMAL LOW (ref 12.0–15.0)
Immature Granulocytes: 0 %
Lymphocytes Relative: 39 %
Lymphs Abs: 2.7 10*3/uL (ref 0.7–4.0)
MCH: 28.9 pg (ref 26.0–34.0)
MCHC: 32.6 g/dL (ref 30.0–36.0)
MCV: 88.7 fL (ref 80.0–100.0)
Monocytes Absolute: 0.6 10*3/uL (ref 0.1–1.0)
Monocytes Relative: 9 %
Neutro Abs: 3.4 10*3/uL (ref 1.7–7.7)
Neutrophils Relative %: 49 %
Platelets: 294 10*3/uL (ref 150–400)
RBC: 4.08 MIL/uL (ref 3.87–5.11)
RDW: 14 % (ref 11.5–15.5)
WBC: 6.9 10*3/uL (ref 4.0–10.5)
nRBC: 0 % (ref 0.0–0.2)

## 2023-10-25 NOTE — ED Triage Notes (Signed)
Patient reports chronic right jaw pain with mild swelling for > 1 year , denies injury or fever .

## 2023-10-26 MED ORDER — METHOCARBAMOL 500 MG PO TABS: 500.0000 mg | ORAL_TABLET | Freq: Three times a day (TID) | ORAL | 0 refills | Status: AC | PRN

## 2023-10-26 NOTE — Discharge Instructions (Addendum)
Please follow up with your primary care doctor or dentist. You may use the prescribed medication as needed, as well as ibuprofen.

## 2023-10-26 NOTE — ED Provider Notes (Signed)
[place] EMERGENCY DEPARTMENT AT [facility] Provider Note   CSN: [id number] Arrival date & time: 10/25/23  2040     History  Chief Complaint  Patient presents with   Right Jaw Pain/Swelling    Sierra Novak is a 47 y.o. female who presents with 1 month of chronic right jaw pain worse with jaw movement.  Radiates to her right ear.  No changes in hearing, no vision changes or pain in the eye.  Associated posterior headache.  No trauma to the area.  History of hypothyroidism on her medication.  Follow-up with her PCP.  HPI     Home Medications Prior to Admission medications   Medication Sig Start Date End Date Taking? Authorizing Provider  methocarbamol (ROBAXIN) 500 MG tablet Take 1 tablet (500 mg total) by mouth every 8 (eight) hours as needed for up to 12 days for muscle spasms. 10/26/23 11/07/23 Yes Eisley Barber R, PA-C  atorvastatin (LIPITOR) 10 MG tablet Take 1 tablet (10 mg total) by mouth daily. 07/17/23   Storm Frisk, MD  levothyroxine (SYNTHROID) 100 MCG tablet Take 1 tablet (100 mcg total) by mouth daily. (Stop ) 07/17/23   Storm Frisk, MD  nystatin (MYCOSTATIN) 100000 UNIT/ML suspension Take 5 mLs (500,000 Units total) by mouth 4 (four) times daily. 10/14/23   Hoy Register, MD  Vitamin D, Ergocalciferol, (DRISDOL) 1.25 MG (50000 UNIT) CAPS capsule Take 1 capsule (50,000 Units total) by mouth every 7 (seven) days. 07/18/23   Storm Frisk, MD      Allergies    Shrimp [shellfish allergy]    Review of Systems   Review of Systems  HENT:         Jaw pain    Physical Exam Updated Vital Signs BP 123/79 (BP Location: Right Arm)   Pulse (!) 57   Temp 97.6 F (36.4 C)   Resp 17   SpO2 100%  Physical Exam Vitals and nursing note reviewed.  Constitutional:      Appearance: She is not ill-appearing or toxic-appearing.  HENT:     Head: Normocephalic and atraumatic.   Eyes:     General: No scleral icterus.        Right eye: No discharge.        Left eye: No discharge.     Conjunctiva/sclera: Conjunctivae normal.  Pulmonary:     Effort: Pulmonary effort is normal.  Skin:    General: Skin is warm and dry.  Neurological:     General: No focal deficit present.     Mental Status: She is alert.  Psychiatric:        Mood and Affect: Mood normal.     ED Results / Procedures / Treatments   Labs (all labs ordered are listed, but only abnormal results are displayed) Labs Reviewed  CBC WITH DIFFERENTIAL/PLATELET - Abnormal; Notable for the following components:      Result Value   Hemoglobin 11.8 (*)    All other components within normal limits  COMPREHENSIVE METABOLIC PANEL - Abnormal; Notable for the following components:   Glucose, Bld 107 (*)    Total Protein 6.4 (*)    All other components within normal limits    EKG None  Radiology No results found.  Procedures Procedures    Medications Ordered in ED Medications - No data to display  ED Course/ Medical Decision Making/ A&P  Medical Decision Making 47 year old who presents with 1 month of right jaw pain worse with chewing.  DDx includes not limited to TMJ, trigeminal neuralgia, dental infection, cellulitis, peritonsillar abscess, adenitis, herpes zoster.  No skin rashes, TTP, decreased range of motion in opening of the mouth secondary to pain   Amount and/or Complexity of Data Reviewed Labs: ordered.  Risk Prescription drug management.   Clinical patient was consistent with TMJ.  Recommend NSAIDs and outpatient follow-up with his history versus PCP.  Clinical concern for emergent underlying etiology of this patient symptoms that would warrant further ED workup or emergent management is exceedingly low.  Sierra Novak voiced understanding of her medical evaluation and treatment plan. Each of their questions answered to their expressed satisfaction.  Return precautions were given.  Patient is  well-appearing, stable, and was discharged in good condition.  This chart was dictated using voice recognition software, Dragon. Despite the best efforts of this provider to proofread and correct errors, errors may still occur which can change documentation meaning.         Final Clinical Impression(s) / ED Diagnoses Final diagnoses:  TMJ (temporomandibular joint syndrome)    Rx / DC Orders ED Discharge Orders          Ordered    methocarbamol (ROBAXIN) 500 MG tablet  Every 8 hours PRN        10/26/23 0448              Doren Kaspar, Eugene Gavia, PA-C 10/26/23 0701    Gilda Crease, MD 10/26/23 [phone]

## 2024-01-07 ENCOUNTER — Other Ambulatory Visit: Payer: Self-pay

## 2024-01-22 ENCOUNTER — Ambulatory Visit: Payer: Self-pay | Attending: Family Medicine | Admitting: Family Medicine

## 2024-01-22 ENCOUNTER — Other Ambulatory Visit: Payer: Self-pay

## 2024-01-22 ENCOUNTER — Encounter: Payer: Self-pay | Admitting: Family Medicine

## 2024-01-22 VITALS — BP 129/81 | HR 73 | Ht 64.0 in | Wt 216.2 lb

## 2024-01-22 DIAGNOSIS — M62838 Other muscle spasm: Secondary | ICD-10-CM

## 2024-01-22 DIAGNOSIS — E039 Hypothyroidism, unspecified: Secondary | ICD-10-CM

## 2024-01-22 DIAGNOSIS — N951 Menopausal and female climacteric states: Secondary | ICD-10-CM

## 2024-01-22 DIAGNOSIS — J328 Other chronic sinusitis: Secondary | ICD-10-CM

## 2024-01-22 MED ORDER — CETIRIZINE HCL 10 MG PO TABS
10.0000 mg | ORAL_TABLET | Freq: Every day | ORAL | 1 refills | Status: DC
  Filled 2024-01-22: qty 30, 30d supply, fill #0
  Filled 2024-02-26: qty 30, 30d supply, fill #1

## 2024-01-22 MED ORDER — VENLAFAXINE HCL ER 37.5 MG PO CP24
37.5000 mg | ORAL_CAPSULE | Freq: Every day | ORAL | 5 refills | Status: AC
  Filled 2024-01-22: qty 30, 30d supply, fill #0
  Filled 2024-02-26: qty 30, 30d supply, fill #1
  Filled 2024-03-30: qty 90, 90d supply, fill #2

## 2024-01-22 MED ORDER — TIZANIDINE HCL 4 MG PO TABS
4.0000 mg | ORAL_TABLET | Freq: Three times a day (TID) | ORAL | 1 refills | Status: AC | PRN
  Filled 2024-01-22 (×2): qty 60, 20d supply, fill #0

## 2024-01-22 NOTE — Patient Instructions (Signed)
Menopausia: qu debe saber Menopause: What to Know La menopausia es el momento de la vida de una mujer en el que los perodos menstruales se detienen. Marca el final de su capacidad para quedar embarazada. Se puede definir como no tener el perodo durante 12 meses sin otra causa mdica. La etapa en que comienza a pasar a la menopausia se llama perimenopausia. Suele ocurrir [employer] 45 y los 55 aos. Puede durar [employer]. Durante la perimenopausia, los niveles hormonales del cuerpo [place]. Esto puede causar sntomas y Audiological scientist a Radiographer, therapeutic. La menopausia puede hacer que sea ms propensa a tener lo siguiente: [employer], que se quiebran con ms facilidad. Depresin. Esta ocurre cuando se siente tristeza o desesperanza. Endurecimiento y Scientist, research (medical) de las arterias. Estos pueden causar infarto de miocardio y accidente cerebrovascular. Cules son las causas? En la [employer], la menopausia es un cambio natural del cuerpo y los niveles hormonales que ocurre con el paso del [place]. Pero en algunos casos, puede deberse a cambios que no son naturales. Incluyen lo siguiente: [place] para [employer]. Efectos secundarios de algunos medicamentos. Qu incrementa el riesgo? Es ms probable que pase por la menopausia de forma temprana si: Tiene un crecimiento anormal (tumor) de la hipfisis en el cerebro. Tiene una enfermedad que [employer] ovarios. Recibe determinados tratamientos para el cncer. Incluyen lo siguiente: Quimioterapia. Terapia hormonal. Radioterapia en la zona que est entre las caderas (pelvis). Fuma mucho o bebe mucho alcohol. Otras personas de su familia han pasado por la menopausia de forma temprana. Es muy delgada. Cules son los signos o sntomas? Es posible que tenga: [place]or[place]. Perodos irregulares. Sudoracin nocturna. Cambios en cmo se siente con respecto al sexo. Usted puede: Tener menos deseo sexual. Sentir ms incomodidad  en torno a su sexualidad. Sequedad vaginal y adelgazamiento de las paredes de la vagina. Esto puede hacer que tener sexo le genere dolor. Cambios en la piel, por ejemplo: Piel seca. Arrugas nuevas. Dolores de [place]. Otros sntomas pueden incluir: Dificultad para dormir. Cambios en el estado de nimo. Problemas de memoria. Aumento de [place]. Crecimiento de vello en la cara y el pecho. Infecciones en la vejiga o dificultad para orinar. Cmo se diagnostica? Pueden diagnosticarle esta afeccin en funcin de lo siguiente: Sus antecedentes mdicos. Un examen. Su edad. Sus antecedentes de [employer]. Sus sntomas. Pruebas hormonales. Cmo se trata? En algunos los casos, no se necesita tratamiento. Hable con su mdico acerca de si debera recibir algn tratamiento. Los tratamientos pueden incluir lo siguiente: Terapia hormonal para la menopausia (THM). Medicamentos para tratar ciertos sntomas. Acupuntura. Vitaminas o suplementos herbales. Antes de Microbiologist, informe al mdico si usted o alguien de su familia tiene o ha tenido: Enfermedad cardaca. Cncer de mama. Cogulos de [place]. Diabetes. Osteoporosis. Siga estas instrucciones en su casa: Comida y bebida  Consuma una dieta equilibrada. Debe incluir: Nils Pyle y verduras frescas. Cereales integrales. Protenas magras. Lcteos con bajo contenido de [place]. Consuma muchos alimentos que contengan calcio y vitamina D. Estos pueden ayudar a [employer]. Los alimentos y bebidas con alto contenido de calcio y hierro son, Eusebio Me otros: [place]ogur y [employer]. Frijoles. Almendras. Sardinas. Brcoli y col rizada. Para ayudar a prevenir los acaloramientos, no consuma lo siguiente: Alcohol. Bebidas con cafena. Comidas condimentadas. Estilo de vida No fume, vapee ni consuma nicotina o tabaco. Intente dormir de 7 a 8 horas todas las noches. Si tiene acaloramientos, es recomendable que: Se  vista  con capas de ropa. Evite las cosas que podran [employer] acaloramientos, como los lugares calientes o el estrs. Respire profundamente y despacio cuando comience a Scientist, water quality. Tenga un ventilador en su casa y en su oficina. Busque maneras de Charity fundraiser. Tal vez deba intentar lo siguiente: Respiracin profunda. Meditacin. Escribir un diario. Consulte al [employer] terapia grupal. La terapia puede ayudarla a obtener apoyo de otras personas que estn pasando por la menopausia. Instrucciones generales  Hable con su mdico antes de tomar cualquier suplemento a base de hierbas. Lleve un registro de los sntomas. Registre: Cundo comienzan. Con qu frecuencia los tiene. Cunto tiempo duran. Use lubricantes o humectantes vaginales. Estos pueden ayudar con: Sequedad vaginal. La comodidad durante el sexo. Comunquese con un mdico si: Es mayor de 84 aos y an tiene Environmental manager. Siente dolor durante el sexo. No ha tenido un perodo menstrual durante 12 meses y luego comienza a Geophysicist/field seismologist por la vagina. Siente dolor al [employer]. Tiene dolores de [place] muy intensos. Solicite ayuda de inmediato si: Est muy deprimida. Tiene mucho sangrado de la vagina. [employer] late [place] rpido. Siente un dolor muy intenso en el vientre o indigestin que no se alivia con medicamentos. Esta informacin no tiene Theme park manager el consejo del mdico. Asegrese de hacerle al mdico cualquier pregunta que tenga. Document Revised: 09/26/2023 Document Reviewed: 09/26/2023 Elsevier Patient Education  2024 [employer].

## 2024-01-22 NOTE — Progress Notes (Signed)
Subjective:  Patient ID: Sierra Novak, female    DOB: Feb 10, 1976  Age: 48 y.o. MRN: [id number]  CC: Medical Management of Chronic Issues (Right ear pain/Cough/congestion/Hot flashes)   HPI Sierra Novak is a 48 y.o. year old female with a history of hypothyroidism, hyperlipidemia here for an office visit.   Interval History: Discussed the use of AI scribe software for clinical note transcription with the patient, who gave verbal consent to proceed.  She presents with a two-month history of right-sided ear pain radiating to the head, right side of neck and shoulder. The pain is described as a constant discomfort affecting the area from the ear to the head and down to the shoulder.  In addition to the ear pain, the patient has been experiencing a persistent cough for the past two weeks. Initially, the cough was accompanied by congestion, which has since resolved. The cough is productive, with the patient reporting the presence of phlegm.  The patient also reports experiencing hot flashes for the past year. These episodes occur predominantly at night and are characterized by periods of feeling hot, followed by feeling cold. The frequency and intensity of these hot flashes vary. The patient continues to menstruate and has not reported any changes in her menstrual cycle.  For her hypothyroidism she is adherent with levothyroxine.      Past Medical History:  Diagnosis Date   Complete miscarriage 01/14/2019   Thyroid disease     Past Surgical History:  Procedure Laterality Date   CESAREAN SECTION     CHOLECYSTECTOMY      Family History  Problem Relation Age of Onset   Hypertension Father    Diabetes Paternal Aunt     Social History   Socioeconomic History   Marital status: Legally Separated    Spouse name: Not on file   Number of children: Not on file   Years of education: Not on file   Highest education level: Not on file  Occupational History   Not on  file  Tobacco Use   Smoking status: Never   Smokeless tobacco: Never  Substance and Sexual Activity   Alcohol use: No    Alcohol/week: 0.0 standard drinks of alcohol   Drug use: No   Sexual activity: Yes    Comment: 1ST INTERCOURSE- 72, PARTNERS- 1  Other Topics Concern   Not on file  Social History Narrative   ** Merged History Encounter **       Social Drivers of Corporate investment banker Strain: Not on file  Food Insecurity: Not on file  Transportation Needs: Not on file  Physical Activity: Not on file  Stress: Not on file  Social Connections: Not on file    Allergies  Allergen Reactions   Shrimp [Shellfish Allergy]     Outpatient Medications Prior to Visit  Medication Sig Dispense Refill   atorvastatin (LIPITOR) 10 MG tablet Take 1 tablet (10 mg total) by mouth daily. 90 tablet 3   levothyroxine (SYNTHROID) 100 MCG tablet Take 1 tablet (100 mcg total) by mouth daily. (Stop ) 90 tablet 1   Vitamin D, Ergocalciferol, (DRISDOL) 1.25 MG (50000 UNIT) CAPS capsule Take 1 capsule (50,000 Units total) by mouth every 7 (seven) days. 14 capsule 5   nystatin (MYCOSTATIN) 100000 UNIT/ML suspension Take 5 mLs (500,000 Units total) by mouth 4 (four) times daily. (Patient not taking: Reported on 01/22/2024) 60 mL 1   No facility-administered medications prior to visit.     ROS  Review of Systems  Constitutional:  Negative for activity change and appetite change.  HENT:  Positive for congestion. Negative for sinus pressure and sore throat.   Respiratory:  Positive for cough. Negative for chest tightness, shortness of breath and wheezing.   Cardiovascular:  Negative for chest pain and palpitations.  Gastrointestinal:  Negative for abdominal distention, abdominal pain and constipation.  Genitourinary: Negative.   Musculoskeletal: Negative.   Psychiatric/Behavioral:  Negative for behavioral problems and dysphoric mood.     Objective:  BP 129/81   Pulse 73   Ht 5\' 4"   (1.626 m)   Wt 216 lb 3.2 oz (98.1 kg)   SpO2 97%   BMI 37.11 kg/m      01/22/2024   10:27 AM 10/26/2023    4:54 AM 10/26/2023    1:45 AM  BP/Weight  Systolic BP 129 123 126  Diastolic BP 81 79 80  Wt. (Lbs) 216.2    BMI 37.11 kg/m2        Physical Exam Constitutional:      Appearance: She is well-developed.  HENT:     Right Ear: Tympanic membrane normal.     Left Ear: Tympanic membrane normal.     Mouth/Throat:     Mouth: Mucous membranes are moist.  Cardiovascular:     Rate and Rhythm: Normal rate.     Heart sounds: Normal heart sounds. No murmur heard. Pulmonary:     Effort: Pulmonary effort is normal.     Breath sounds: Normal breath sounds. No wheezing or rales.  Chest:     Chest wall: No tenderness.  Abdominal:     General: Bowel sounds are normal. There is no distension.     Palpations: Abdomen is soft. There is no mass.     Tenderness: There is no abdominal tenderness.  Musculoskeletal:        General: Normal range of motion.     Cervical back: Normal range of motion.     Right lower leg: No edema.     Left lower leg: No edema.  Neurological:     Mental Status: She is alert and oriented to person, place, and time.  Psychiatric:        Mood and Affect: Mood normal.        Latest Ref Rng & Units 10/25/2023    9:15 PM 07/17/2023   10:13 AM 09/12/2022   10:23 AM  CMP  Glucose 70 - 99 mg/dL 027  94  98   BUN 6 - 20 mg/dL 17  11  10    Creatinine 0.44 - 1.00 mg/dL 2.53  6.64  4.03   Sodium 135 - 145 mmol/L 139  137  141   Potassium 3.5 - 5.1 mmol/L 3.8  4.4  4.7   Chloride 98 - 111 mmol/L 106  104  108   CO2 22 - 32 mmol/L 23  20  21    Calcium 8.9 - 10.3 mg/dL 9.6  9.3  8.8   Total Protein 6.5 - 8.1 g/dL 6.4  6.5  6.4   Total Bilirubin 0.3 - 1.2 mg/dL 0.3  0.6  0.4   Alkaline Phos 38 - 126 U/L 55  66  65   AST 15 - 41 U/L 26  22  21    ALT 0 - 44 U/L 24  21  19      Lipid Panel     Component Value Date/Time   CHOL 155 07/17/2023 1013   TRIG 70  07/17/2023 1013  HDL 57 07/17/2023 1013   CHOLHDL 2.7 07/17/2023 1013   CHOLHDL 4.0 03/22/2014 1121   VLDL 23 03/22/2014 1121   LDLCALC 84 07/17/2023 1013    CBC    Component Value Date/Time   WBC 6.9 10/25/2023 2115   RBC 4.08 10/25/2023 2115   HGB 11.8 (L) 10/25/2023 2115   HGB 12.6 07/17/2023 1013   HCT 36.2 10/25/2023 2115   HCT 39.2 07/17/2023 1013   PLT 294 10/25/2023 2115   PLT 308 07/17/2023 1013   MCV 88.7 10/25/2023 2115   MCV 92 07/17/2023 1013   MCH 28.9 10/25/2023 2115   MCHC 32.6 10/25/2023 2115   RDW 14.0 10/25/2023 2115   RDW 13.1 07/17/2023 1013   LYMPHSABS 2.7 10/25/2023 2115   LYMPHSABS 2.0 07/17/2023 1013   MONOABS 0.6 10/25/2023 2115   EOSABS 0.2 10/25/2023 2115   EOSABS 0.2 07/17/2023 1013   BASOSABS 0.0 10/25/2023 2115   BASOSABS 0.0 07/17/2023 1013    Lab Results  Component Value Date   HGBA1C 5.5 07/23/2016    Lab Results  Component Value Date   TSH 3.170 07/17/2023    Assessment & Plan:      Muscle spasm Pain from the right ear to the head and down to the right shoulder for the past two months. -Prescribe muscle relaxant for muscle spasm.  Chronic Sinusitis Persistent cough for two weeks following resolution of congestion. Postnasal drip noted. -Prescribe Zyrtec to help with postnasal drip and cough.  Vasomotor menopausal symptoms Nighttime hot flashes for the past year, with periods of increased and decreased severity. -Menstrual cycles have not ceased -Discussed pathophysiology of menopause -Prescribe Effexor to help manage hot flashes.  Hypothyroidism Currently on Levothyroxine. -Check thyroid levels today and adjust Levothyroxine dose as necessary based on results.          Meds ordered this encounter  Medications   tiZANidine (ZANAFLEX) 4 MG tablet    Sig: Take 1 tablet (4 mg total) by mouth every 8 (eight) hours as needed.    Dispense:  60 tablet    Refill:  1   venlafaxine XR (EFFEXOR XR) 37.5 MG 24 hr  capsule    Sig: Take 1 capsule (37.5 mg total) by mouth daily with breakfast. For hot flashes    Dispense:  30 capsule    Refill:  5   cetirizine (ZYRTEC) 10 MG tablet    Sig: Take 1 tablet (10 mg total) by mouth daily.    Dispense:  30 tablet    Refill:  1    Follow-up: Return in about 6 months (around 07/21/2024) for Chronic medical conditions.       Hoy Register, MD, FAAFP. [facility] and [facility] [place], [place] [phone]   01/22/2024, 10:51 AM

## 2024-01-23 ENCOUNTER — Other Ambulatory Visit: Payer: Self-pay | Admitting: Family Medicine

## 2024-01-23 ENCOUNTER — Other Ambulatory Visit: Payer: Self-pay

## 2024-01-23 LAB — T3: T3, Total: 127 ng/dL (ref 71–180)

## 2024-01-23 LAB — TSH: TSH: 2.53 u[IU]/mL (ref 0.450–4.500)

## 2024-01-23 LAB — T4, FREE: Free T4: 1.25 ng/dL (ref 0.82–1.77)

## 2024-01-23 MED ORDER — LEVOTHYROXINE SODIUM 100 MCG PO TABS
100.0000 ug | ORAL_TABLET | Freq: Every day | ORAL | 1 refills | Status: DC
  Filled 2024-01-23 – 2024-03-30 (×2): qty 90, 90d supply, fill #0
  Filled 2024-06-29: qty 90, 90d supply, fill #1

## 2024-02-26 ENCOUNTER — Other Ambulatory Visit: Payer: Self-pay

## 2024-03-30 ENCOUNTER — Other Ambulatory Visit: Payer: Self-pay | Admitting: Family Medicine

## 2024-03-30 ENCOUNTER — Other Ambulatory Visit: Payer: Self-pay

## 2024-03-31 ENCOUNTER — Other Ambulatory Visit: Payer: Self-pay

## 2024-03-31 MED ORDER — CETIRIZINE HCL 10 MG PO TABS
10.0000 mg | ORAL_TABLET | Freq: Every day | ORAL | 1 refills | Status: AC
  Filled 2024-03-31: qty 90, 90d supply, fill #0

## 2024-04-02 ENCOUNTER — Other Ambulatory Visit: Payer: Self-pay

## 2024-05-22 ENCOUNTER — Other Ambulatory Visit: Payer: Self-pay

## 2024-06-29 ENCOUNTER — Other Ambulatory Visit (HOSPITAL_COMMUNITY): Payer: Self-pay

## 2024-06-29 ENCOUNTER — Other Ambulatory Visit: Payer: Self-pay

## 2024-07-20 ENCOUNTER — Telehealth: Payer: Self-pay | Admitting: Family Medicine

## 2024-07-20 NOTE — Telephone Encounter (Signed)
Contacted pt confirmed appt

## 2024-07-21 ENCOUNTER — Ambulatory Visit: Payer: Self-pay | Attending: Family Medicine | Admitting: Family Medicine

## 2024-07-21 ENCOUNTER — Other Ambulatory Visit: Payer: Self-pay

## 2024-07-21 ENCOUNTER — Encounter: Payer: Self-pay | Admitting: Family Medicine

## 2024-07-21 VITALS — BP 135/86 | HR 61 | Ht 64.0 in | Wt 212.2 lb

## 2024-07-21 DIAGNOSIS — Z91018 Allergy to other foods: Secondary | ICD-10-CM

## 2024-07-21 DIAGNOSIS — E039 Hypothyroidism, unspecified: Secondary | ICD-10-CM

## 2024-07-21 DIAGNOSIS — E782 Mixed hyperlipidemia: Secondary | ICD-10-CM

## 2024-07-21 DIAGNOSIS — Z131 Encounter for screening for diabetes mellitus: Secondary | ICD-10-CM

## 2024-07-21 MED ORDER — ATORVASTATIN CALCIUM 10 MG PO TABS
10.0000 mg | ORAL_TABLET | Freq: Every day | ORAL | 3 refills | Status: AC
  Filled 2024-07-21 – 2024-11-12 (×2): qty 90, 90d supply, fill #0

## 2024-07-21 MED ORDER — EPINEPHRINE 0.3 MG/0.3ML IJ SOAJ
0.3000 mg | INTRAMUSCULAR | 1 refills | Status: AC | PRN
  Filled 2024-07-21: qty 2, fill #0
  Filled 2024-08-07: qty 2, 1d supply, fill #0

## 2024-07-21 NOTE — Progress Notes (Signed)
 Subjective:  Patient ID: Sierra Novak, female    DOB: February 25, 1976  Age: 48 y.o. MRN: [id number]  CC: Medical Management of Chronic Issues     Discussed the use of AI scribe software for clinical note transcription with the patient, who gave verbal consent to proceed.  History of Present Illness Sierra Novak is a 48 year old female with a history of hypothyroidism, hyperlipidemia who presents with symptoms of an allergic reaction after consuming shrimp.  She experienced an allergic reaction on July 9th after consuming shrimp. 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 Benadryl  was taken, which alleviated some symptoms, but swelling persists in her hands.  She denies respiratory symptoms when this occurred.  Her hands are swollen and red, with similar redness on her legs. She removed a ring due to hand swelling.  She is currently taking levothyroxine  for hypothyroidism and atorvastatin  for cholesterol management.    Past Medical History:  Diagnosis Date   Complete miscarriage 01/14/2019   Thyroid  disease     Past Surgical History:  Procedure Laterality Date   CESAREAN SECTION     CHOLECYSTECTOMY      Family History  Problem Relation Age of Onset   Hypertension Father    Diabetes Paternal Aunt     Social History   Socioeconomic History   Marital status: Legally Separated    Spouse name: Not on file   Number of children: Not on file   Years of education: Not on file   Highest education level: Not on file  Occupational History   Not on file  Tobacco Use   Smoking status: Never   Smokeless tobacco: Never  Substance and Sexual Activity   Alcohol use: No    Alcohol/week: 0.0 standard drinks of alcohol   Drug use: No   Sexual activity: Yes    Comment: 1ST INTERCOURSE- 74, PARTNERS- 1  Other Topics Concern   Not on file  Social History Narrative   ** Merged History Encounter **       Social Drivers of Corporate investment banker Strain:  Not on file  Food Insecurity: Not on file  Transportation Needs: Not on file  Physical Activity: Not on file  Stress: Not on file  Social Connections: Not on file    Allergies  Allergen Reactions   Shrimp [Shellfish Allergy ]     Outpatient Medications Prior to Visit  Medication Sig Dispense Refill   levothyroxine  (SYNTHROID ) 100 MCG tablet Take 1 tablet (100 mcg total) by mouth daily. (Stop 125mcg) 90 tablet 1   atorvastatin  (LIPITOR) 10 MG tablet Take 1 tablet (10 mg total) by mouth daily. 90 tablet 3   cetirizine  (ZYRTEC ) 10 MG tablet Take 1 tablet (10 mg total) by mouth daily. (Patient not taking: Reported on 07/21/2024) 90 tablet 1   nystatin  (MYCOSTATIN ) 100000 UNIT/ML suspension Take 5 mLs (500,000 Units total) by mouth 4 (four) times daily. (Patient not taking: Reported on 07/21/2024) 60 mL 1   tiZANidine  (ZANAFLEX ) 4 MG tablet Take 1 tablet (4 mg total) by mouth every 8 (eight) hours as needed. (Patient not taking: Reported on 07/21/2024) 60 tablet 1   venlafaxine  XR (EFFEXOR  XR) 37.5 MG 24 hr capsule Take 1 capsule (37.5 mg total) by mouth daily with breakfast. For hot flashes (Patient not taking: Reported on 07/21/2024) 30 capsule 5   Vitamin D , Ergocalciferol , (DRISDOL ) 1.25 MG (50000 UNIT) CAPS capsule Take 1 capsule (50,000 Units total) by mouth every 7 (seven)  days. (Patient not taking: Reported on 07/21/2024) 14 capsule 5   No facility-administered medications prior to visit.     ROS Review of Systems  Constitutional:  Negative for activity change and appetite change.  HENT:  Negative for sinus pressure and sore throat.   Respiratory:  Negative for chest tightness, shortness of breath and wheezing.   Cardiovascular:  Negative for chest pain and palpitations.  Gastrointestinal:  Negative for abdominal distention, abdominal pain and constipation.  Genitourinary: Negative.   Musculoskeletal:        See HPI  Psychiatric/Behavioral:  Negative for behavioral problems and  dysphoric mood.     Objective:  BP 135/86   Pulse 61   Ht 5' 4 (1.626 m)   Wt 212 lb 3.2 oz (96.3 kg)   SpO2 100%   BMI 36.42 kg/m      07/21/2024   10:40 AM 01/22/2024   10:27 AM 10/26/2023    4:54 AM  BP/Weight  Systolic BP 135 129 123  Diastolic BP 86 81 79  Wt. (Lbs) 212.2 216.2   BMI 36.42 kg/m2 37.11 kg/m2       Physical Exam Constitutional:      Appearance: She is well-developed.  Cardiovascular:     Rate and Rhythm: Normal rate.     Heart sounds: Normal heart sounds. No murmur heard. Pulmonary:     Effort: Pulmonary effort is normal.     Breath sounds: Normal breath sounds. No wheezing or rales.  Chest:     Chest wall: No tenderness.  Abdominal:     General: Bowel sounds are normal. There is no distension.     Palpations: Abdomen is soft. There is no mass.     Tenderness: There is no abdominal tenderness.  Musculoskeletal:        General: Normal range of motion.     Right lower leg: No edema.     Left lower leg: No edema.     Comments: Slight edema of hands  Neurological:     Mental Status: She is alert and oriented to person, place, and time.  Psychiatric:        Mood and Affect: Mood normal.        Latest Ref Rng & Units 10/25/2023    9:15 PM 07/17/2023   10:13 AM 09/12/2022   10:23 AM  CMP  Glucose 70 - 99 mg/dL 892  94  98   BUN 6 - 20 mg/dL 17  11  10    Creatinine 0.44 - 1.00 mg/dL 9.37  9.44  9.39   Sodium 135 - 145 mmol/L 139  137  141   Potassium 3.5 - 5.1 mmol/L 3.8  4.4  4.7   Chloride 98 - 111 mmol/L 106  104  108   CO2 22 - 32 mmol/L 23  20  21    Calcium  8.9 - 10.3 mg/dL 9.6  9.3  8.8   Total Protein 6.5 - 8.1 g/dL 6.4  6.5  6.4   Total Bilirubin 0.3 - 1.2 mg/dL 0.3  0.6  0.4   Alkaline Phos 38 - 126 U/L 55  66  65   AST 15 - 41 U/L 26  22  21    ALT 0 - 44 U/L 24  21  19      Lipid Panel     Component Value Date/Time   CHOL 155 07/17/2023 1013   TRIG 70 07/17/2023 1013   HDL 57 07/17/2023 1013   CHOLHDL 2.7 07/17/2023  1013  CHOLHDL 4.0 03/22/2014 1121   VLDL 23 03/22/2014 1121   LDLCALC 84 07/17/2023 1013    CBC    Component Value Date/Time   WBC 6.9 10/25/2023 2115   RBC 4.08 10/25/2023 2115   HGB 11.8 (L) 10/25/2023 2115   HGB 12.6 07/17/2023 1013   HCT 36.2 10/25/2023 2115   HCT 39.2 07/17/2023 1013   PLT 294 10/25/2023 2115   PLT 308 07/17/2023 1013   MCV 88.7 10/25/2023 2115   MCV 92 07/17/2023 1013   MCH 28.9 10/25/2023 2115   MCHC 32.6 10/25/2023 2115   RDW 14.0 10/25/2023 2115   RDW 13.1 07/17/2023 1013   LYMPHSABS 2.7 10/25/2023 2115   LYMPHSABS 2.0 07/17/2023 1013   MONOABS 0.6 10/25/2023 2115   EOSABS 0.2 10/25/2023 2115   EOSABS 0.2 07/17/2023 1013   BASOSABS 0.0 10/25/2023 2115   BASOSABS 0.0 07/17/2023 1013    Lab Results  Component Value Date   HGBA1C 5.5 07/23/2016    Lab Results  Component Value Date   TSH 2.530 01/22/2024        Assessment & Plan Food Allergy  Experienced allergic reaction to shrimp. Prescribed EpiPen  for emergencies. - Prescribe EpiPen  for emergency use.  Hypothyroidism On levothyroxine  100 mcg daily. Blood test needed to assess thyroid  function. - Order blood tests to evaluate thyroid  function. - Adjust levothyroxine  dosage based on blood test results.  Hyperlipidemia On atorvastatin  10 mg daily. -Lipid panel ordered today -Low-cholesterol diet  Screening for diabetes mellitus Routine diabetes screening necessary.     Meds ordered this encounter  Medications   atorvastatin  (LIPITOR) 10 MG tablet    Sig: Take 1 tablet (10 mg total) by mouth daily.    Dispense:  90 tablet    Refill:  3   EPINEPHrine  (EPIPEN  2-PAK) 0.3 mg/0.3 mL IJ SOAJ injection    Sig: Inject 0.3 mg into the muscle as needed for anaphylaxis.    Dispense:  1 each    Refill:  1    Follow-up: Return in about 6 months (around 01/21/2025) for Chronic medical conditions.       Corrina Sabin, MD, FAAFP. [facility] and Wellness  [place], [place] [phone]   07/21/2024, 12:26 PM

## 2024-07-21 NOTE — Patient Instructions (Signed)
 Epinephrine  Auto-Injector Qu es este medicamento? La EPINEFRINA trata reacciones alrgicas graves (anafilaxis). Tambin podra usarse para tratar ataques repentinos de asma. Reduce los efectos de una reaccin alrgica, tal como dificultad para respirar o hinchazn de la cara, los labios y Administrator. Llame al servicio de emergencias despus de la inyeccin. Podra necesitar tratamiento adicional. Este medicamento puede ser utilizado para otros usos; si tiene alguna pregunta consulte con su proveedor de atencin mdica o con su farmacutico. MARCAS COMUNES: Adrenaclick , Adrenalin , Auvi-Q , Epinephrine  Museum/gallery exhibitions officer, Epinephrine  Professional with Safety Seal, epinephrinesnap, epinephrinesnap-v, EpiPen , Epipen  Jr, EPIsnap Epinephrine , SYMJEPI , Twinject  Qu le debo informar a mi profesional de la salud antes de tomar este medicamento? Necesitan saber si usted presenta alguno de los siguientes problemas o situaciones: Diabetes (nivel elevado de azcar en la sangre) Glaucoma Enfermedad cardiaca Presin arterial alta Enfermedad renal Mal de Parkinson Feocromocitoma Enfermedad tiroidea Una reaccin alrgica o inusual a la epinefrina, a otros medicamentos, alimentos, colorantes o conservantes Si est embarazada o buscando quedar embarazada Si est amamantando a un beb Cmo debo utilizar este medicamento? Este medicamento se inyecta en un msculo o debajo de la piel. Le ensearn cmo prepararlo y administrarlo. Use el medicamento segn las instrucciones en la etiqueta. No lo use con una frecuencia mayor a la indicada. Es importante que deseche las agujas y las jeringas usadas en un recipiente resistente a los pinchazos. No las deseche en la basura. Si no tiene un recipiente resistente a los pinchazos, llame a su farmacutico o a su equipo de atencin para obtenerlo. Este medicamento viene con INSTRUCCIONES DE USO. Pdale a su farmacutico que le indique cmo usar [employer]. Lea la  informacin atentamente. Hable con su farmacutico o su equipo de atencin si tiene alguna pregunta. Hable con su equipo de atencin sobre el uso de este medicamento en nios. Aunque se puede recetar para ciertas afecciones, existen precauciones que deben tomarse. Sobredosis: Pngase en contacto inmediatamente con un centro toxicolgico o una sala de urgencia si usted cree que haya tomado demasiado medicamento.<br>ATENCIN: [employer] es solo para usted. No comparta este medicamento con nadie. Qu sucede si me olvido de una dosis? No se aplica en este caso. Este medicamento no es para uso regular. Solamente debe usarse segn sea necesario. Qu puede interactuar con este medicamento? No use este medicamento con ninguno de los siguientes productos: Anestsicos generales, tales como desflurano, isoflurano, sevoflurano Este medicamento tambin podra interactuar con los siguientes productos: Antihistamnicos para alergia, tos y resfriado Ciertos medicamentos para la presin arterial, enfermedad cardiaca y frecuencia cardiaca irregular Ciertos medicamentos para afecciones de salud mental Ciertos medicamentos para el mal de Parkinson, como entacapona Digoxina Diurticos Doxapram Alcaloides ergotnicos, tales como dihidroergotamina, ergonovina, ergotamina, metilergonovina Levotiroxina IMAO, tales como Marplan, Nardil y Parnate Oxitocina Fenotiazinas, tales como clorpromazina, proclorperazina, tioridazina Medicamentos esteroideos, tales como la prednisona o la cortisona Teofilina Puede ser que esta lista no menciona todas las posibles interacciones. Informe a su profesional de [employer] de [employer] productos a base de hierbas, medicamentos de [place] o suplementos nutritivos que est tomando. Si usted fuma, consume bebidas alcohlicas o si utiliza drogas ilegales, indqueselo tambin a su profesional de [employer]. Algunas sustancias pueden interactuar con su medicamento. A qu debo estar  atento al usar [employer]? Visite a su equipo de atencin para que revise su evolucin peridicamente. Si los sntomas no comienzan a mejorar o si empeoran, informe a su equipo de atencin. Llame al servicio de emergencias si tiene problemas para respirar.  Qu efectos secundarios puedo tener al [employer] este medicamento? Efectos secundarios que debe informar a su equipo de atencin tan pronto como sea posible: Reacciones alrgicas: erupcin cutnea, comezn/picazn, urticaria, hinchazn de la cara, los labios, la lengua o la garganta Ataque cardiaco: Engineer, mining u opresin en el pecho, los hombros, los brazos o la [place], nuseas, falta de [place], piel fra o sudorosa, sensacin de desmayo o aturdimiento Cambios en el ritmo cardiaco: frecuencia cardiaca rpida o irregular, mareos, sensacin de desmayo o aturdimiento, Journalist, newspaper, dificultad para respirar [employer] riones: disminucin en la cantidad de orina, hinchazn de los tobillos, las manos o los Manufacturing engineer, enrojecimiento o Marketing executive de la inyeccin Efectos secundarios que generalmente no requieren atencin mdica (debe informarlos a su equipo de atencin si persisten o si son molestos): Ansiedad, nerviosismo Mareos Dolor de [place] Palpitaciones cardacas: frecuencia cardiaca rpida, intensa o irregular Debilidad muscular Nuseas Piel plida, prdida de color en la parte interna de los prpados, el interior de la boca o las uas Sudoracin Temblores o sacudidas Vmito Puede ser que esta lista no menciona todos los posibles efectos secundarios. Comunquese a su mdico por asesoramiento mdico [employer]. Usted puede informar los efectos secundarios a la FDA por telfono al 1-800-FDA-1088. Dnde debo guardar mi medicina? Mantenga fuera del alcance de nios y Neurosurgeon. Guarde a temperatura ambiente, entre 20 y 25 grados Celsius (68 y 26 grados Fahrenheit). No congele. Evite exponer al calor o al  fro extremo. Por ejemplo, no lo deje en la guantera de un vehculo. Proteja de Statistician. Mantenga en la caja externa hasta que est listo para usarlo. Deseche todo el medicamento que no haya utilizado despus de la fecha de vencimiento. Para desechar los medicamentos que ya no necesite o que estn vencidos: Lleve el medicamento a un programa de recuperacin de medicamentos. Consulte con su farmacia o con una entidad reguladora para encontrar un lugar donde llevarlo. Si no puede Government social research officer, pregntele a Film/video editor o a su equipo de atencin cmo desecharlo de IT consultant. ATENCIN: Este folleto es un resumen. Puede ser que no cubra toda la posible informacin. Si usted tiene preguntas acerca de esta medicina, consulte con su mdico, su farmacutico o su profesional de Radiographer, therapeutic.  2024 Elsevier/Gold Standard (2023-05-13 00:00:00)

## 2024-07-22 ENCOUNTER — Ambulatory Visit: Payer: Self-pay | Admitting: Family Medicine

## 2024-07-22 LAB — CMP14+EGFR
ALT: 19 IU/L (ref 0–32)
AST: 21 IU/L (ref 0–40)
Albumin: 4.1 g/dL (ref 3.9–4.9)
Alkaline Phosphatase: 60 IU/L (ref 44–121)
BUN/Creatinine Ratio: 18 (ref 9–23)
BUN: 9 mg/dL (ref 6–24)
Bilirubin Total: 0.4 mg/dL (ref 0.0–1.2)
CO2: 22 mmol/L (ref 20–29)
Calcium: 9.3 mg/dL (ref 8.7–10.2)
Chloride: 104 mmol/L (ref 96–106)
Creatinine, Ser: 0.51 mg/dL — ABNORMAL LOW (ref 0.57–1.00)
Globulin, Total: 2.4 g/dL (ref 1.5–4.5)
Glucose: 88 mg/dL (ref 70–99)
Potassium: 4.5 mmol/L (ref 3.5–5.2)
Sodium: 139 mmol/L (ref 134–144)
Total Protein: 6.5 g/dL (ref 6.0–8.5)
eGFR: 114 mL/min/1.73 (ref >59)

## 2024-07-22 LAB — LP+NON-HDL CHOLESTEROL
Cholesterol, Total: 140 mg/dL (ref 100–199)
HDL: 43 mg/dL (ref >39)
LDL Chol Calc (NIH): 77 mg/dL (ref 0–99)
Total Non-HDL-Chol (LDL+VLDL): 97 mg/dL (ref 0–129)
Triglycerides: 108 mg/dL (ref 0–149)
VLDL Cholesterol Cal: 20 mg/dL (ref 5–40)

## 2024-07-22 LAB — TSH: TSH: 2.07 u[IU]/mL (ref 0.450–4.500)

## 2024-07-22 LAB — HEMOGLOBIN A1C
Est. average glucose Bld gHb Est-mCnc: 108 mg/dL
Hgb A1c MFr Bld: 5.4 % (ref 4.8–5.6)

## 2024-07-22 LAB — T3: T3, Total: 138 ng/dL (ref 71–180)

## 2024-07-22 LAB — T4, FREE: Free T4: 1.3 ng/dL (ref 0.82–1.77)

## 2024-07-22 MED ORDER — LEVOTHYROXINE SODIUM 100 MCG PO TABS
100.0000 ug | ORAL_TABLET | Freq: Every day | ORAL | 1 refills | Status: DC
  Filled 2024-07-22 – 2024-10-06 (×2): qty 90, 90d supply, fill #0

## 2024-07-23 ENCOUNTER — Other Ambulatory Visit: Payer: Self-pay

## 2024-08-03 ENCOUNTER — Other Ambulatory Visit: Payer: Self-pay

## 2024-08-07 ENCOUNTER — Other Ambulatory Visit: Payer: Self-pay

## 2024-08-24 ENCOUNTER — Other Ambulatory Visit: Payer: Self-pay

## 2024-09-21 ENCOUNTER — Other Ambulatory Visit: Payer: Self-pay

## 2024-09-25 ENCOUNTER — Ambulatory Visit: Payer: Self-pay

## 2024-09-29 ENCOUNTER — Ambulatory Visit: Payer: Self-pay | Attending: Family Medicine

## 2024-10-06 ENCOUNTER — Other Ambulatory Visit: Payer: Self-pay | Admitting: Family Medicine

## 2024-10-06 ENCOUNTER — Other Ambulatory Visit: Payer: Self-pay

## 2024-10-06 MED ORDER — VITAMIN D (ERGOCALCIFEROL) 1.25 MG (50000 UNIT) PO CAPS
50000.0000 [IU] | ORAL_CAPSULE | ORAL | 0 refills | Status: DC
  Filled 2024-10-06: qty 12, 84d supply, fill #0

## 2024-10-14 ENCOUNTER — Other Ambulatory Visit: Payer: Self-pay

## 2024-11-05 ENCOUNTER — Ambulatory Visit: Payer: Self-pay

## 2024-11-05 NOTE — Telephone Encounter (Signed)
 Can we reach out to patient and get her scheduled with any provider at any office.

## 2024-11-05 NOTE — Telephone Encounter (Signed)
 FYI Only or Action Required?: Action required by provider: update on patient condition.  Patient was last seen in primary care on 07/21/2024 by Newlin, Enobong, MD.  Called Nurse Triage reporting Foot Swelling.  Symptoms began several months ago.  Interventions attempted: Nothing.  Symptoms are: unchanged.  Triage Disposition: See HCP Within 4 Hours (Or PCP Triage)  Patient/caregiver understands and will follow disposition?: Yes   Copied from CRM #[id number]. Topic: Clinical - Red Word Triage >> Nov 05, 2024 10:03 AM Antony RAMAN wrote: Red Word that prompted transfer to Nurse Triage: swelling in foot for more than 2 weeks Reason for Disposition  [1] Thigh, calf, or ankle swelling AND [2] only 1 side  Answer Assessment - Initial Assessment Questions 1. ONSET: When did the swelling start? (e.g., minutes, hours, days)     More than two months ago 2. LOCATION: What part of the leg is swollen?  Are both legs swollen or just one leg?     Left ankle  4. REDNESS: Is there redness or signs of infection?     At times after being on feet for a long time 5. PAIN: Is the swelling painful to touch? If Yes, ask: How painful is it?   (Scale 1-10; mild, moderate or severe)     Soreness with standing 6. FEVER: Do you have a fever? If Yes, ask: What is it, how was it measured, and when did it start?      denies 7. CAUSE: What do you think is causing the leg swelling?     unknown 8. MEDICAL HISTORY: Do you have a history of blood clots (e.g., DVT), cancer, heart failure, kidney disease, or liver failure?     Varicose veins 9. RECURRENT SYMPTOM: Have you had leg swelling before? If Yes, ask: When was the last time? What happened that time?     denies 10. OTHER SYMPTOMS: Do you have any other symptoms? (e.g., chest pain, difficulty breathing)       denies  Protocols used: Leg Swelling and Edema-A-AH

## 2024-11-06 NOTE — Telephone Encounter (Signed)
 Noted

## 2024-11-06 NOTE — Telephone Encounter (Signed)
 Patient called back for scheduling. Called [facility] who stated they would help the patient. Unable to conference call due to a system error but was able to transfer them.

## 2024-11-06 NOTE — Telephone Encounter (Signed)
 Patient dropped on previous call. 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 Patient wrote down appt information and notified of placed on the waitlist. ED/UC precautions understood.

## 2024-11-12 ENCOUNTER — Ambulatory Visit (HOSPITAL_COMMUNITY)
Admission: RE | Admit: 2024-11-12 | Discharge: 2024-11-12 | Disposition: A | Discharge status: Home / Self Care | Payer: Self-pay | Source: Ambulatory Visit | Attending: Nurse Practitioner | Admitting: Nurse Practitioner

## 2024-11-12 ENCOUNTER — Encounter: Payer: Self-pay | Admitting: Nurse Practitioner

## 2024-11-12 ENCOUNTER — Ambulatory Visit (INDEPENDENT_AMBULATORY_CARE_PROVIDER_SITE_OTHER): Payer: Self-pay | Admitting: Nurse Practitioner

## 2024-11-12 ENCOUNTER — Other Ambulatory Visit: Payer: Self-pay

## 2024-11-12 VITALS — BP 125/58 | HR 71 | Wt 206.4 lb

## 2024-11-12 DIAGNOSIS — R600 Localized edema: Secondary | ICD-10-CM

## 2024-11-12 DIAGNOSIS — L03116 Cellulitis of left lower limb: Secondary | ICD-10-CM

## 2024-11-12 DIAGNOSIS — I868 Varicose veins of other specified sites: Secondary | ICD-10-CM

## 2024-11-12 MED ORDER — CEPHALEXIN 500 MG PO CAPS
500.0000 mg | ORAL_CAPSULE | Freq: Three times a day (TID) | ORAL | 0 refills | Status: AC
  Filled 2024-11-12: qty 3, 1d supply, fill #0
  Filled 2024-11-12: qty 30, 10d supply, fill #0

## 2024-11-12 NOTE — Progress Notes (Signed)
 Subjective   Patient ID: Sierra Novak, female    DOB: Apr 01, 1976, 48 y.o.   MRN: [id number]  Chief Complaint  Patient presents with   Foot Swelling    Left foot, has been a problem for the last 3 months, pitting, varicose veins, Happens anytime she's on her feet , does not wear compression socks    Referring provider: Delbert Clam, MD  Sierra Novak is a 48 y.o. female with Past Medical History: 01/14/2019: Complete miscarriage No date: Thyroid  disease   HPI  Patient presents today for a acute visit.  This is a patient of Dr. Newlin.  Patient complains today of recent left lower extremity swelling.  She does have varicose veins to bilateral lower extremities.  Her lower left leg is erythematous and warm to the touch.  We will trial Keflex.  We will order ultrasound.  Patient does work long hours on her feet and has not been wearing compression hose.  We advised that she should start wearing compression hose at work. Elevate legs when possible at home. Denies f/c/s, n/v/d, hemoptysis, PND.     Allergies  Allergen Reactions   Shrimp [Shellfish Allergy ]     Immunization History  Administered Date(s) Administered   Influenza, Seasonal, Injecte, Preservative Fre 10/14/2023   Influenza,inj,Quad PF,6+ Mos 09/28/2014, 09/17/2016, 11/07/2020, 02/07/2022, 01/16/2023   PFIZER(Purple Top)SARS-COV-2 Vaccination 04/01/2020, 07/30/2020   Tdap 11/07/2020    Tobacco History: Social History   Tobacco Use  Smoking Status Never  Smokeless Tobacco Never   Counseling given: Not Answered   Outpatient Encounter Medications as of 11/12/2024  Medication Sig   atorvastatin  (LIPITOR) 10 MG tablet Take 1 tablet (10 mg total) by mouth daily.   cephALEXin (KEFLEX) 500 MG capsule Take 1 capsule (500 mg total) by mouth 3 (three) times daily for 10 days.   EPINEPHrine  (EPIPEN  2-PAK) 0.3 mg/0.3 mL IJ SOAJ injection Inject 0.3 mg into the muscle as needed for anaphylaxis.    levothyroxine  (SYNTHROID ) 100 MCG tablet Take 1 tablet (100 mcg total) by mouth daily. (Stop 125mcg)   Vitamin D , Ergocalciferol , (DRISDOL ) 1.25 MG (50000 UNIT) CAPS capsule Take 1 capsule (50,000 Units total) by mouth every 7 (seven) days.   cetirizine  (ZYRTEC ) 10 MG tablet Take 1 tablet (10 mg total) by mouth daily. (Patient not taking: Reported on 11/12/2024)   nystatin  (MYCOSTATIN ) 100000 UNIT/ML suspension Take 5 mLs (500,000 Units total) by mouth 4 (four) times daily. (Patient not taking: Reported on 11/12/2024)   tiZANidine  (ZANAFLEX ) 4 MG tablet Take 1 tablet (4 mg total) by mouth every 8 (eight) hours as needed. (Patient not taking: Reported on 11/12/2024)   venlafaxine  XR (EFFEXOR  XR) 37.5 MG 24 hr capsule Take 1 capsule (37.5 mg total) by mouth daily with breakfast. For hot flashes (Patient not taking: Reported on 11/12/2024)   No facility-administered encounter medications on file as of 11/12/2024.    Review of Systems  Review of Systems  Constitutional: Negative.   HENT: Negative.    Cardiovascular:  Positive for leg swelling.  Gastrointestinal: Negative.   Allergic/Immunologic: Negative.   Neurological: Negative.   Psychiatric/Behavioral: Negative.       Objective:   BP (!) 125/58 (BP Location: Left Arm, Patient Position: Sitting, Cuff Size: Large)   Pulse 71   Wt 206 lb 6.4 oz (93.6 kg)   SpO2 100%   BMI 35.43 kg/m   Wt Readings from Last 5 Encounters:  11/12/24 206 lb 6.4 oz (93.6 kg)  07/21/24 212 lb  3.2 oz (96.3 kg)  01/22/24 216 lb 3.2 oz (98.1 kg)  10/14/23 213 lb (96.6 kg)  07/17/23 211 lb (95.7 kg)     Physical Exam Vitals and nursing note reviewed.  Constitutional:      General: She is not in acute distress.    Appearance: She is well-developed.  Cardiovascular:     Rate and Rhythm: Normal rate and regular rhythm.  Pulmonary:     Effort: Pulmonary effort is normal.     Breath sounds: Normal breath sounds.  Musculoskeletal:     Right lower leg:  Edema present.     Left lower leg: Edema present.  Neurological:     Mental Status: She is alert and oriented to person, place, and time.       Assessment & Plan:   Peripheral edema -     Cephalexin; Take 1 capsule (500 mg total) by mouth 3 (three) times daily for 10 days.  Dispense: 3 capsule; Refill: 0 -     VAS US  LOWER EXTREMITY VENOUS (DVT); Future -     Ambulatory referral to Vascular Surgery  Varicose veins of both upper extremities -     Ambulatory referral to Vascular Surgery  Cellulitis of leg, left -     Cephalexin; Take 1 capsule (500 mg total) by mouth 3 (three) times daily for 10 days.  Dispense: 3 capsule; Refill: 0 -     VAS US  LOWER EXTREMITY VENOUS (DVT); Future     Return if symptoms worsen or fail to improve.   Bascom GORMAN Borer, NP 11/12/2024

## 2024-11-13 ENCOUNTER — Ambulatory Visit: Payer: Self-pay | Admitting: Nurse Practitioner

## 2024-12-16 ENCOUNTER — Other Ambulatory Visit: Payer: Self-pay

## 2024-12-16 ENCOUNTER — Ambulatory Visit: Payer: Self-pay | Attending: Family Medicine | Admitting: Family Medicine

## 2024-12-16 ENCOUNTER — Encounter: Payer: Self-pay | Admitting: Family Medicine

## 2024-12-16 VITALS — BP 129/66 | Temp 98.2°F | Ht 64.0 in | Wt 210.6 lb

## 2024-12-16 DIAGNOSIS — E782 Mixed hyperlipidemia: Secondary | ICD-10-CM

## 2024-12-16 DIAGNOSIS — E039 Hypothyroidism, unspecified: Secondary | ICD-10-CM

## 2024-12-16 DIAGNOSIS — Z1231 Encounter for screening mammogram for malignant neoplasm of breast: Secondary | ICD-10-CM

## 2024-12-16 DIAGNOSIS — I868 Varicose veins of other specified sites: Secondary | ICD-10-CM

## 2024-12-16 DIAGNOSIS — Z23 Encounter for immunization: Secondary | ICD-10-CM

## 2024-12-16 MED ORDER — TRIAMCINOLONE ACETONIDE 0.1 % EX CREA
1.0000 | TOPICAL_CREAM | Freq: Two times a day (BID) | CUTANEOUS | 0 refills | Status: DC
  Filled 2024-12-16: qty 30, 15d supply, fill #0

## 2024-12-16 NOTE — Progress Notes (Signed)
 Subjective:  Patient ID: Sierra Novak, female    DOB: 02/02/1976  Age: 48 y.o. MRN: [id number]  CC: Leg Swelling (Swelling for 3 months in legs)     Discussed the use of AI scribe software for clinical note transcription with the patient, who gave verbal consent to proceed.  History of Present Illness Sierra Novak is a 48 year old female  with a history of hypothyroidism, hyperlipidemia who presents with leg swelling and pain.  For the past three months she has had bilateral leg swelling and pain, with more swelling in the left leg and more pain in the right. Swelling is mainly in the left, improves overnight, and worsens through the day with prolonged standing. She stands for long periods during her job and also while performing duties at home, which increases swelling and pain. She also has itching over the varicose veins.  About six weeks ago she had a leg ultrasound that showed no DVT and she was told her circulation was poor. She uses compression stockings, which decrease swelling and improve comfort. She takes levothyroxine  and atorvastatin  as prescribed.    Past Medical History:  Diagnosis Date   Complete miscarriage 01/14/2019   Thyroid  disease     Past Surgical History:  Procedure Laterality Date   CESAREAN SECTION     CHOLECYSTECTOMY      Family History  Problem Relation Age of Onset   Hypertension Father    Diabetes Paternal Aunt     Social History   Socioeconomic History   Marital status: Legally Separated    Spouse name: Not on file   Number of children: Not on file   Years of education: Not on file   Highest education level: Not on file  Occupational History   Not on file  Tobacco Use   Smoking status: Never   Smokeless tobacco: Never  Substance and Sexual Activity   Alcohol use: No    Alcohol/week: 0.0 standard drinks of alcohol   Drug use: No   Sexual activity: Yes    Comment: 1ST INTERCOURSE- 6, PARTNERS- 1  Other Topics  Concern   Not on file  Social History Narrative   ** Merged History Encounter **       Social Drivers of Health   Tobacco Use: Low Risk (12/16/2024)   Patient History    Smoking Tobacco Use: Never    Smokeless Tobacco Use: Never    Passive Exposure: Not on file  Financial Resource Strain: Not on file  Food Insecurity: Not on file  Transportation Needs: Not on file  Physical Activity: Not on file  Stress: Not on file  Social Connections: Not on file  Depression (PHQ2-9): Low Risk (07/21/2024)   Depression (PHQ2-9)    PHQ-2 Score: 4  Alcohol Screen: Not on file  Housing: Not on file  Utilities: Not on file  Health Literacy: Not on file    Allergies[1]  Outpatient Medications Prior to Visit  Medication Sig Dispense Refill   atorvastatin  (LIPITOR) 10 MG tablet Take 1 tablet (10 mg total) by mouth daily. 90 tablet 3   EPINEPHrine  (EPIPEN  2-PAK) 0.3 mg/0.3 mL IJ SOAJ injection Inject 0.3 mg into the muscle as needed for anaphylaxis. 1 each 1   levothyroxine  (SYNTHROID ) 100 MCG tablet Take 1 tablet (100 mcg total) by mouth daily. (Stop 125mcg) 90 tablet 1   Vitamin D , Ergocalciferol , (DRISDOL ) 1.25 MG (50000 UNIT) CAPS capsule Take 1 capsule (50,000 Units total) by mouth every 7 (seven)  days. 12 capsule 0   cetirizine  (ZYRTEC ) 10 MG tablet Take 1 tablet (10 mg total) by mouth daily. (Patient not taking: Reported on 12/16/2024) 90 tablet 1   nystatin  (MYCOSTATIN ) 100000 UNIT/ML suspension Take 5 mLs (500,000 Units total) by mouth 4 (four) times daily. (Patient not taking: Reported on 12/16/2024) 60 mL 1   tiZANidine  (ZANAFLEX ) 4 MG tablet Take 1 tablet (4 mg total) by mouth every 8 (eight) hours as needed. (Patient not taking: Reported on 12/16/2024) 60 tablet 1   venlafaxine  XR (EFFEXOR  XR) 37.5 MG 24 hr capsule Take 1 capsule (37.5 mg total) by mouth daily with breakfast. For hot flashes (Patient not taking: Reported on 12/16/2024) 30 capsule 5   No facility-administered medications  prior to visit.     ROS Review of Systems  Constitutional:  Negative for activity change and appetite change.  HENT:  Negative for sinus pressure and sore throat.   Respiratory:  Negative for chest tightness, shortness of breath and wheezing.   Cardiovascular:  Positive for leg swelling. Negative for chest pain and palpitations.  Gastrointestinal:  Negative for abdominal distention, abdominal pain and constipation.  Genitourinary: Negative.   Musculoskeletal: Negative.   Psychiatric/Behavioral:  Negative for behavioral problems and dysphoric mood.     Objective:  BP 129/66   Temp 98.2 F (36.8 C) (Oral)   Ht 5' 4 (1.626 m)   Wt 210 lb 9.6 oz (95.5 kg)   SpO2 100%   BMI 36.15 kg/m      12/16/2024   10:03 AM 11/12/2024    1:45 PM 07/21/2024   10:40 AM  BP/Weight  Systolic BP 129 125 135  Diastolic BP 66 58 86  Wt. (Lbs) 210.6 206.4 212.2  BMI 36.15 kg/m2 35.43 kg/m2 36.42 kg/m2      Physical Exam Constitutional:      Appearance: She is well-developed.  Cardiovascular:     Rate and Rhythm: Normal rate.     Heart sounds: Normal heart sounds. No murmur heard. Pulmonary:     Effort: Pulmonary effort is normal.     Breath sounds: Normal breath sounds. No wheezing or rales.  Chest:     Chest wall: No tenderness.  Abdominal:     General: Bowel sounds are normal. There is no distension.     Palpations: Abdomen is soft. There is no mass.     Tenderness: There is no abdominal tenderness.  Musculoskeletal:        General: Normal range of motion.     Right lower leg: Edema present.     Left lower leg: Edema present.     Comments: Varicose veins in both legs  Neurological:     Mental Status: She is alert and oriented to person, place, and time.  Psychiatric:        Mood and Affect: Mood normal.        Latest Ref Rng & Units 07/21/2024   11:29 AM 10/25/2023    9:15 PM 07/17/2023   10:13 AM  CMP  Glucose 70 - 99 mg/dL 88  892  94   BUN 6 - 24 mg/dL 9  17  11     Creatinine 0.57 - 1.00 mg/dL 9.48  9.37  9.44   Sodium 134 - 144 mmol/L 139  139  137   Potassium 3.5 - 5.2 mmol/L 4.5  3.8  4.4   Chloride 96 - 106 mmol/L 104  106  104   CO2 20 - 29 mmol/L 22  23  20   Calcium  8.7 - 10.2 mg/dL 9.3  9.6  9.3   Total Protein 6.0 - 8.5 g/dL 6.5  6.4  6.5   Total Bilirubin 0.0 - 1.2 mg/dL 0.4  0.3  0.6   Alkaline Phos 44 - 121 IU/L 60  55  66   AST 0 - 40 IU/L 21  26  22    ALT 0 - 32 IU/L 19  24  21      Lipid Panel     Component Value Date/Time   CHOL 140 07/21/2024 1129   TRIG 108 07/21/2024 1129   HDL 43 07/21/2024 1129   CHOLHDL 2.7 07/17/2023 1013   CHOLHDL 4.0 03/22/2014 1121   VLDL 23 03/22/2014 1121   LDLCALC 77 07/21/2024 1129    CBC    Component Value Date/Time   WBC 6.9 10/25/2023 2115   RBC 4.08 10/25/2023 2115   HGB 11.8 (L) 10/25/2023 2115   HGB 12.6 07/17/2023 1013   HCT 36.2 10/25/2023 2115   HCT 39.2 07/17/2023 1013   PLT 294 10/25/2023 2115   PLT 308 07/17/2023 1013   MCV 88.7 10/25/2023 2115   MCV 92 07/17/2023 1013   MCH 28.9 10/25/2023 2115   MCHC 32.6 10/25/2023 2115   RDW 14.0 10/25/2023 2115   RDW 13.1 07/17/2023 1013   LYMPHSABS 2.7 10/25/2023 2115   LYMPHSABS 2.0 07/17/2023 1013   MONOABS 0.6 10/25/2023 2115   EOSABS 0.2 10/25/2023 2115   EOSABS 0.2 07/17/2023 1013   BASOSABS 0.0 10/25/2023 2115   BASOSABS 0.0 07/17/2023 1013    Lab Results  Component Value Date   HGBA1C 5.4 07/21/2024   Lab Results  Component Value Date   TSH 2.070 07/21/2024        Assessment & Plan Varicose veins of lower extremities Chronic varicose veins with swelling and pain, more pronounced in the left leg. Symptoms worsen with prolonged standing and improve with leg elevation. Previous ultrasound ruled out blood clots. - Continue use of compression stockings. - Elevate legs as much as possible. - Provided contact information for vascular specialist to schedule appointment. - Discussed potential weight loss as a  beneficial measure.  Hypothyroidism Well-managed on current levothyroxine  regimen. - Checked thyroid  levels today.  Mixed hyperlipidemia Well-controlled on atorvastatin . - Continue atorvastatin . - Low-cholesterol diet  General Health Maintenance Due for routine screenings. Encounter for screening for mammogram- Ordered mammogram. - Scheduled Pap smear for December next year.      Meds ordered this encounter  Medications   triamcinolone  cream (KENALOG ) 0.1 %    Sig: Apply 1 Application topically 2 (two) times daily.    Dispense:  30 g    Refill:  0    Follow-up: Return in about 6 months (around 06/16/2025) for Chronic medical conditions.       Corrina Sabin, MD, FAAFP. [facility] [place], [place] [phone]   12/16/2024, 12:42 PM     [1]  Allergies Allergen Reactions   Shrimp [Shellfish Allergy ]

## 2024-12-16 NOTE — Patient Instructions (Signed)
Venas varicosas Varicose Veins Las venas varicosas son venas que se han agrandado y [place], y se han tornado sinuosas. Suelen aparecer en las piernas. Cules son las causas? La causa de esta afeccin es el dao en las vlvulas de la vena. Estas vlvulas ayudan a que la sangre regrese al [employer]. Cuando estn daadas y dejan de funcionar correctamente, la sangre puede ir en direccin inversa y Engineer, maintenance a las venas cerca de la piel, lo que hace que las venas se agranden y se vean sinuosas. La afeccin puede surgir por cualquier factor que haga que la sangre retorne, como un [place], una actividad que obligue a estar de pie de forma prolongada o la obesidad. Qu incrementa el riesgo? Los siguientes factores pueden hacer que sea ms propenso a Clinical cytogeneticist afeccin: Personal assistant de Veterinary surgeon. Estar embarazada. Tener sobrepeso. Fumar. Haber tenido una trombosis venosa profunda previa o tener un trastorno trombtico. Envejecimiento. El riesgo aumenta con la edad. Tener una afeccin llamada sndrome de [place]. Cules son los signos o sntomas? Los sntomas de esta afeccin incluyen: Venas abultadas, sinuosas y [place]. Sensacin de [employer]. Estos sntomas pueden empeorar hacia el final del da. Dolor en las piernas. Estos sntomas pueden empeorar hacia el final del da. Hinchazn en la pierna. Cambios en el color de la piel que est [employer]. La hinchazn o Chief Technology Officer en las piernas pueden limitar sus [place]. Los sntomas pueden empeorar al estar sentado o de pie durante largos perodos. Cmo se diagnostica? Esta afeccin se puede diagnosticar en funcin de lo siguiente: Sus sntomas, antecedentes familiares, niveles de [place] y [place] de vida. Un examen fsico. Tambin pueden hacerle estudios como una ecografa o radiografa. Cmo se trata? El tratamiento de esta afeccin puede incluir lo siguiente: Careers information officer sentado o de pie en la  misma posicin durante mucho tiempo. Usar medias de compresin. Estas medias ayudan a Transport planner formacin de cogulos de [place] y a reducir la hinchazn de las piernas. Levantar (elevar) las piernas al descansar. Bajar de [place]. Hacer actividad fsica con regularidad. Si tiene sntomas persistentes o desea mejorar el aspecto de las venas varicosas, puede optar por un procedimiento para anular dichas venas o extraerlas. Entre los tratamientos no quirrgicos para anular las venas, se incluyen los siguientes: Regulatory affairs officer. En este tratamiento, se inyecta una solucin en la vena para anularla. Tratamiento con lser. La vena se calienta con un lser para anularla. Ablacin venosa por radiofrecuencia. Se [place] una corriente elctrica que se produce mediante ondas de radio para anular la vena. Entre los tratamientos quirrgicos para extraer las venas, se incluyen los siguientes: [place]. En este procedimiento, las venas se extraen a travs de pequeas incisiones que se realizan por encima de las venas. Ligadura venosa y varicectoma. En este procedimiento, se realizan incisiones por encima de las venas. Luego, las venas se extraen despus de atarse (ligarse) con puntos (suturas). Siga estas instrucciones en su casa: Medicamentos Tome los medicamentos de venta libre y los recetados solamente como se lo haya indicado el mdico. Si le recetaron un antibitico, tmelo como se lo haya indicado el mdico. No deje de usar el antibitico aunque comience a Actor. Actividad Camine todo lo que pueda. Caminar aumenta el flujo sanguneo. Esto ayuda a que la sangre regrese al corazn y Chief Executive Officer la presin en las venas. No permanezca de pie o sentado en una misma posicin [employer]. No se siente con las piernas cruzadas. Evite estar sentado durante largos perodos  sin moverse. Levntese y camine un poco cada 1 a 2 horas. Esto es importante para mejorar el flujo sanguneo y la respiracin. Pida  ayuda si se siente dbil o inestable. Retome sus actividades normales segn lo indicado por el mdico. Pregntele al mdico qu actividades son seguras para usted. Haga ejercicio como se lo haya indicado el mdico. Instrucciones generales  Siga las instrucciones del mdico en lo que respecta a la dieta. De noche, eleve las piernas a una altura superior al nivel del corazn. Si se corta en la piel que est por encima de una vena varicosa y esta sangra: Recustese con la pierna levantada. Coloque un pao limpio sobre el corte y presione firmemente sobre l hasta que el sangrado se [place]. Aplique una venda (vendaje) sobre el corte. Beba suficiente lquido como para Pharmacologist la orina de color amarillo plido. No consuma ningn producto que contenga nicotina o tabaco. Estos productos incluyen cigarrillos, tabaco para Theatre manager y aparatos de vapeo, como los Administrator, Civil Service. Si necesita ayuda para dejar de fumar, consulte al [employer]. Use medias de compresin como se lo haya indicado su mdico. No use otra clase de vestimenta ajustada alrededor de las piernas, la pelvis o la cintura. Concurra a todas las visitas de seguimiento. Esto es importante. Comunquese con un mdico si: La piel alrededor de las venas varicosas empieza a Lobbyist. Siente ms dolor o tiene enrojecimiento, sensibilidad o hinchazn dura sobre la vena. Est incmodo debido al dolor. Se corta en la piel de encima de una vena varicosa y no deja de Geophysicist/field seismologist. Solicite ayuda de inmediato si: Midwife. Tiene dificultad para respirar. Siente dolor intenso en la pierna. Resumen Las venas varicosas son venas que se han agrandado y [place], y se han tornado sinuosas. Suelen aparecer en las piernas. La causa de esta afeccin es el dao en las vlvulas de la vena. Estas vlvulas ayudan a que la sangre regrese al [employer]. El tratamiento de esta afeccin incluye hacer movimientos frecuentes, usar medias de compresin,  perder peso y Radio producer ejercicios de forma regular. En algunos casos, se realizan procedimientos que anulan o extraen las venas. Los tratamientos no quirrgicos para cerrar las venas incluyen escleroterapia, terapia lser y ablacin venosa por radiofrecuencia. Esta informacin no tiene Theme park manager el consejo del mdico. Asegrese de hacerle al mdico cualquier pregunta que tenga. Document Revised: 06/19/2021 Document Reviewed: 06/19/2021 Elsevier Patient Education  2024 [employer].

## 2024-12-17 ENCOUNTER — Other Ambulatory Visit: Payer: Self-pay

## 2024-12-17 ENCOUNTER — Ambulatory Visit: Payer: Self-pay | Admitting: Family Medicine

## 2024-12-17 ENCOUNTER — Telehealth: Payer: Self-pay

## 2024-12-17 LAB — T4, FREE: Free T4: 1.5 ng/dL (ref 0.82–1.77)

## 2024-12-17 LAB — BASIC METABOLIC PANEL WITH GFR
BUN/Creatinine Ratio: 22 (ref 9–23)
BUN: 14 mg/dL (ref 6–24)
CO2: 24 mmol/L (ref 20–29)
Calcium: 9.8 mg/dL (ref 8.7–10.2)
Chloride: 105 mmol/L (ref 96–106)
Creatinine, Ser: 0.63 mg/dL (ref 0.57–1.00)
Glucose: 101 mg/dL — ABNORMAL HIGH (ref 70–99)
Potassium: 4.8 mmol/L (ref 3.5–5.2)
Sodium: 140 mmol/L (ref 134–144)
eGFR: 109 mL/min/1.73 (ref >59)

## 2024-12-17 LAB — T3: T3, Total: 126 ng/dL (ref 71–180)

## 2024-12-17 LAB — TSH: TSH: 2.47 u[IU]/mL (ref 0.450–4.500)

## 2024-12-17 MED ORDER — LEVOTHYROXINE SODIUM 100 MCG PO TABS
100.0000 ug | ORAL_TABLET | Freq: Every day | ORAL | 1 refills | Status: AC
  Filled 2024-12-17: qty 90, 90d supply, fill #0
  Filled 2025-01-06: qty 60, 60d supply, fill #0

## 2024-12-17 NOTE — Telephone Encounter (Signed)
 Telephoned patient at mobile number using interpreter#[phone]. Left a voice message with BCCCP scheduling information.

## 2024-12-22 ENCOUNTER — Other Ambulatory Visit: Payer: Self-pay

## 2024-12-29 ENCOUNTER — Other Ambulatory Visit: Payer: Self-pay | Admitting: Family Medicine

## 2024-12-29 NOTE — Telephone Encounter (Unsigned)
 Copied from CRM [id number]. Topic: Clinical - Medication Refill >> Dec 29, 2024 12:23 PM Lauren C wrote: Medication: Vitamin D , Ergocalciferol , (DRISDOL ) 1.25 MG (50000 UNIT) CAPS capsule  Has the patient contacted their pharmacy? No (Agent: If no, request that the patient contact the pharmacy for the refill. If patient does not wish to contact the pharmacy document the reason why and proceed with request.) (Agent: If yes, when and what did the pharmacy advise?)  This is the patient's preferred pharmacy:  [facility] [address], [address] [place] [address] Phone: [phone] Fax: [phone]  Is this the correct pharmacy for this prescription? Yes If no, delete pharmacy and type the correct one.   Has the prescription been filled recently? Yes  Is the patient out of the medication? Yes  Has the patient been seen for an appointment in the last year OR does the patient have an upcoming appointment? Yes  Can we respond through MyChart? No  Agent: Please be advised that Rx refills may take up to 3 business days. We ask that you follow-up with your pharmacy.

## 2024-12-31 NOTE — Telephone Encounter (Signed)
 Requested medication (s) are due for refill today: Yes  Requested medication (s) are on the active medication list: Yes  Last refill:  10/06/24  Future visit scheduled: Yes  Notes to clinic:  Unable to refill per protocol, cannot delegate.      Requested Prescriptions  Pending Prescriptions Disp Refills   Vitamin D , Ergocalciferol , (DRISDOL ) 1.25 MG (50000 UNIT) CAPS capsule 12 capsule 0    Sig: Take 1 capsule (50,000 Units total) by mouth every 7 (seven) days.     Endocrinology:  Vitamins - Vitamin D  Supplementation 2 Failed - 12/31/2024  8:15 AM      Failed - Manual Review: Route requests for 50,000 IU strength to the provider      Failed - Vitamin D  in normal range and within 360 days    Vit D, 25-Hydroxy  Date Value Ref Range Status  07/17/2023 20.2 (L) 30.0 - 100.0 ng/mL Final    Comment:    Vitamin D  deficiency has been defined by the Institute of Medicine and an Endocrine Society practice guideline as a level of serum 25-OH vitamin D  less than 20 ng/mL (1,2). The Endocrine Society went on to further define vitamin D  insufficiency as a level between 21 and 29 ng/mL (2). 1. IOM (Institute of Medicine). 2010. Dietary reference    intakes for calcium  and D. [place]: The    [employer]. 2. Holick MF, Binkley [place], Bischoff-Ferrari HA, et al.    Evaluation, treatment, and prevention of vitamin D     deficiency: an Endocrine Society clinical practice    guideline. JCEM. 2011 Jul; 96(7):1911-30.          Passed - Ca in normal range and within 360 days    Calcium   Date Value Ref Range Status  12/16/2024 9.8 8.7 - 10.2 mg/dL Final         Passed - Valid encounter within last 12 months    Recent Outpatient Visits           2 weeks ago Varicose veins of both upper extremities   [facility] [place] Delbert Clam, MD   5 months ago History of food allergy    [facility] [place]. [facility] Delbert Clam, MD   11 months ago Acquired hypothyroidism   [facility] [place]. [facility] Delbert Clam, MD   1 year ago Candidal intertrigo   [facility] [place]. [facility] Delbert Clam, MD   1 year ago Acquired hypothyroidism   [facility] . [facility] Brien Belvie BRAVO, MD

## 2025-01-06 ENCOUNTER — Other Ambulatory Visit: Payer: Self-pay

## 2025-01-06 ENCOUNTER — Other Ambulatory Visit: Payer: Self-pay | Admitting: Family Medicine

## 2025-01-06 MED ORDER — VITAMIN D (ERGOCALCIFEROL) 1.25 MG (50000 UNIT) PO CAPS
50000.0000 [IU] | ORAL_CAPSULE | ORAL | 0 refills | Status: AC
  Filled 2025-01-06: qty 12, 84d supply, fill #0

## 2025-01-11 ENCOUNTER — Other Ambulatory Visit: Payer: Self-pay

## 2025-01-14 ENCOUNTER — Ambulatory Visit
Admission: RE | Admit: 2025-01-14 | Discharge: 2025-01-14 | Disposition: A | Payer: Self-pay | Source: Ambulatory Visit | Attending: Family Medicine | Admitting: Family Medicine

## 2025-01-14 DIAGNOSIS — Z1231 Encounter for screening mammogram for malignant neoplasm of breast: Secondary | ICD-10-CM

## 2025-01-21 ENCOUNTER — Other Ambulatory Visit: Payer: Self-pay

## 2025-01-21 ENCOUNTER — Ambulatory Visit: Payer: Self-pay | Attending: Family Medicine | Admitting: Family Medicine

## 2025-01-21 ENCOUNTER — Encounter: Payer: Self-pay | Admitting: Family Medicine

## 2025-01-21 VITALS — BP 117/72 | HR 69 | Temp 98.4°F | Ht 64.0 in | Wt 213.6 lb

## 2025-01-21 DIAGNOSIS — M25561 Pain in right knee: Secondary | ICD-10-CM

## 2025-01-21 DIAGNOSIS — E559 Vitamin D deficiency, unspecified: Secondary | ICD-10-CM

## 2025-01-21 DIAGNOSIS — G8929 Other chronic pain: Secondary | ICD-10-CM

## 2025-01-21 DIAGNOSIS — I868 Varicose veins of other specified sites: Secondary | ICD-10-CM

## 2025-01-21 DIAGNOSIS — R928 Other abnormal and inconclusive findings on diagnostic imaging of breast: Secondary | ICD-10-CM

## 2025-01-21 DIAGNOSIS — R5383 Other fatigue: Secondary | ICD-10-CM

## 2025-01-21 MED ORDER — MELOXICAM 7.5 MG PO TABS
7.5000 mg | ORAL_TABLET | Freq: Every day | ORAL | 1 refills | Status: AC
  Filled 2025-01-21: qty 30, 30d supply, fill #0

## 2025-01-21 MED ORDER — TRIAMCINOLONE ACETONIDE 0.1 % EX CREA
1.0000 | TOPICAL_CREAM | Freq: Two times a day (BID) | CUTANEOUS | 1 refills | Status: AC
  Filled 2025-01-21: qty 30, 15d supply, fill #0

## 2025-01-21 NOTE — Progress Notes (Signed)
 "  Subjective:  Patient ID: Sierra Novak, female    DOB: 05-29-1976  Age: 49 y.o. MRN: [id number]  CC: Medical Management of Chronic Issues (Very tired all the time/Pain in joints)     Discussed the use of AI scribe software for clinical note transcription with the patient, who gave verbal consent to proceed.  History of Present Illness Sierra Novak is a 49 year old female with a history of hypothyroidism, hyperlipidemia, varicose veins who presents with joint pain and varicose veins.  She has joint pain and swelling, mainly around her right knee in the posterior aspect, described as deep or superficial with a burning quality. She uses a triamcinolone  cream on her legs and notes ongoing symptoms.  She has varicose veins with itchiness and swelling and uses compression stockings for symptom relief.  She has an upcoming appointment with vascular surgery in 7 months.  She feels very tired with increased sleepiness, which she associates with menopause. Her menstrual cycles have been irregular since April, with skipped periods and intermittent return to normal. She had low vitamin D  in the past and notices better energy when she takes supplementation.    Past Medical History:  Diagnosis Date   Complete miscarriage 01/14/2019   Thyroid  disease     Past Surgical History:  Procedure Laterality Date   CESAREAN SECTION     CHOLECYSTECTOMY      Family History  Problem Relation Age of Onset   Hypertension Father    Diabetes Paternal Aunt     Social History   Socioeconomic History   Marital status: Legally Separated    Spouse name: Not on file   Number of children: Not on file   Years of education: Not on file   Highest education level: Not on file  Occupational History   Not on file  Tobacco Use   Smoking status: Never   Smokeless tobacco: Never  Substance and Sexual Activity   Alcohol use: No    Alcohol/week: 0.0 standard drinks of alcohol   Drug use:  No   Sexual activity: Yes    Comment: 1ST INTERCOURSE- 62, PARTNERS- 1  Other Topics Concern   Not on file  Social History Narrative   ** Merged History Encounter **       Social Drivers of Health   Tobacco Use: Low Risk (01/21/2025)   Patient History    Smoking Tobacco Use: Never    Smokeless Tobacco Use: Never    Passive Exposure: Not on file  Financial Resource Strain: Not on file  Food Insecurity: Not on file  Transportation Needs: Not on file  Physical Activity: Not on file  Stress: Not on file  Social Connections: Not on file  Depression (PHQ2-9): Low Risk (07/21/2024)   Depression (PHQ2-9)    PHQ-2 Score: 4  Alcohol Screen: Not on file  Housing: Not on file  Utilities: Not on file  Health Literacy: Not on file    Allergies[1]  Outpatient Medications Prior to Visit  Medication Sig Dispense Refill   atorvastatin  (LIPITOR) 10 MG tablet Take 1 tablet (10 mg total) by mouth daily. 90 tablet 3   EPINEPHrine  (EPIPEN  2-PAK) 0.3 mg/0.3 mL IJ SOAJ injection Inject 0.3 mg into the muscle as needed for anaphylaxis. 1 each 1   levothyroxine  (SYNTHROID ) 100 MCG tablet Take 1 tablet (100 mcg total) by mouth daily. (Stop 125mcg) 90 tablet 1   triamcinolone  cream (KENALOG ) 0.1 % Apply 1 Application topically 2 (two) times daily. 30  g 0   Vitamin D , Ergocalciferol , (DRISDOL ) 1.25 MG (50000 UNIT) CAPS capsule Take 1 capsule (50,000 Units total) by mouth every 7 (seven) days. 12 capsule 0   cetirizine  (ZYRTEC ) 10 MG tablet Take 1 tablet (10 mg total) by mouth daily. (Patient not taking: Reported on 01/21/2025) 90 tablet 1   nystatin  (MYCOSTATIN ) 100000 UNIT/ML suspension Take 5 mLs (500,000 Units total) by mouth 4 (four) times daily. (Patient not taking: Reported on 01/21/2025) 60 mL 1   tiZANidine  (ZANAFLEX ) 4 MG tablet Take 1 tablet (4 mg total) by mouth every 8 (eight) hours as needed. (Patient not taking: Reported on 01/21/2025) 60 tablet 1   venlafaxine  XR (EFFEXOR  XR) 37.5 MG 24 hr  capsule Take 1 capsule (37.5 mg total) by mouth daily with breakfast. For hot flashes (Patient not taking: Reported on 01/21/2025) 30 capsule 5   No facility-administered medications prior to visit.     ROS Review of Systems  Constitutional:  Positive for fatigue. Negative for activity change and appetite change.  HENT:  Negative for sinus pressure and sore throat.   Respiratory:  Negative for chest tightness, shortness of breath and wheezing.   Cardiovascular:  Negative for chest pain and palpitations.  Gastrointestinal:  Negative for abdominal distention, abdominal pain and constipation.  Genitourinary: Negative.   Musculoskeletal:        See HPI  Psychiatric/Behavioral:  Negative for behavioral problems and dysphoric mood.     Objective:  BP 117/72   Pulse 69   Temp 98.4 F (36.9 C) (Oral)   Ht 5' 4 (1.626 m)   Wt 213 lb 9.6 oz (96.9 kg)   SpO2 99%   BMI 36.66 kg/m      01/21/2025   10:23 AM 12/16/2024   10:03 AM 11/12/2024    1:45 PM  BP/Weight  Systolic BP 117 129 125  Diastolic BP 72 66 58  Wt. (Lbs) 213.6 210.6 206.4  BMI 36.66 kg/m2 36.15 kg/m2 35.43 kg/m2      Physical Exam Constitutional:      Appearance: She is well-developed.  Cardiovascular:     Rate and Rhythm: Normal rate.     Heart sounds: Normal heart sounds. No murmur heard. Pulmonary:     Effort: Pulmonary effort is normal.     Breath sounds: Normal breath sounds. No wheezing or rales.  Chest:     Chest wall: No tenderness.  Abdominal:     General: Bowel sounds are normal. There is no distension.     Palpations: Abdomen is soft. There is no mass.     Tenderness: There is no abdominal tenderness.  Musculoskeletal:        General: Normal range of motion.     Right lower leg: No edema.     Left lower leg: No edema.     Comments: Varicose vein in lower extremities Slight tenderness in posterior lateral aspect of right knee on flexion and extension Left knee is normal Normal appearance of  both knees  Neurological:     Mental Status: She is alert and oriented to person, place, and time.  Psychiatric:        Mood and Affect: Mood normal.        Latest Ref Rng & Units 12/16/2024   10:40 AM 07/21/2024   11:29 AM 10/25/2023    9:15 PM  CMP  Glucose 70 - 99 mg/dL 898  88  892   BUN 6 - 24 mg/dL 14  9  17  Creatinine 0.57 - 1.00 mg/dL 9.36  9.48  9.37   Sodium 134 - 144 mmol/L 140  139  139   Potassium 3.5 - 5.2 mmol/L 4.8  4.5  3.8   Chloride 96 - 106 mmol/L 105  104  106   CO2 20 - 29 mmol/L 24  22  23    Calcium  8.7 - 10.2 mg/dL 9.8  9.3  9.6   Total Protein 6.0 - 8.5 g/dL  6.5  6.4   Total Bilirubin 0.0 - 1.2 mg/dL  0.4  0.3   Alkaline Phos 44 - 121 IU/L  60  55   AST 0 - 40 IU/L  21  26   ALT 0 - 32 IU/L  19  24     Lipid Panel     Component Value Date/Time   CHOL 140 07/21/2024 1129   TRIG 108 07/21/2024 1129   HDL 43 07/21/2024 1129   CHOLHDL 2.7 07/17/2023 1013   CHOLHDL 4.0 03/22/2014 1121   VLDL 23 03/22/2014 1121   LDLCALC 77 07/21/2024 1129    CBC    Component Value Date/Time   WBC 6.9 10/25/2023 2115   RBC 4.08 10/25/2023 2115   HGB 11.8 (L) 10/25/2023 2115   HGB 12.6 07/17/2023 1013   HCT 36.2 10/25/2023 2115   HCT 39.2 07/17/2023 1013   PLT 294 10/25/2023 2115   PLT 308 07/17/2023 1013   MCV 88.7 10/25/2023 2115   MCV 92 07/17/2023 1013   MCH 28.9 10/25/2023 2115   MCHC 32.6 10/25/2023 2115   RDW 14.0 10/25/2023 2115   RDW 13.1 07/17/2023 1013   LYMPHSABS 2.7 10/25/2023 2115   LYMPHSABS 2.0 07/17/2023 1013   MONOABS 0.6 10/25/2023 2115   EOSABS 0.2 10/25/2023 2115   EOSABS 0.2 07/17/2023 1013   BASOSABS 0.0 10/25/2023 2115   BASOSABS 0.0 07/17/2023 1013    Lab Results  Component Value Date   HGBA1C 5.4 07/21/2024   Lab Results  Component Value Date   TSH 2.470 12/16/2024        Assessment & Plan Varicose veins of lower extremities Chronic varicose veins with itching and swelling managed with topical cream and  compression stockings. - Refilled topical cream for itching. - Continue use of compression stockings. - Keep upcoming appointment with vascular  Right knee pain Intermittent pain with burning sensation, possibly related to musculoskeletal causes, location of pain does not coincide with sites of varicose veins - Prescribed meloxicam  for inflammation and knee pain. - Recommended knee brace for support. - Weight loss will be beneficial  Vitamin D  deficiency Previous deficiency with improved energy upon supplementation. - Ordered blood test to check vitamin D  levels. - Will adjust vitamin D  supplementation based on test results.  Fatigue Chronic fatigue possibly related to menopause, vitamin D  deficiency, or anemia. Previous thyroid  function tests normal. - Ordered blood tests to check for anemia and vitamin D  levels. - Will adjust treatment based on test results.       No orders of the defined types were placed in this encounter.   Follow-up: No follow-ups on file.       Corrina Sabin, MD, FAAFP. [facility] [place], [place] [phone]   01/21/2025, 10:28 AM    [1]  Allergies Allergen Reactions   Shrimp [Shellfish Allergy ]    "

## 2025-01-21 NOTE — Patient Instructions (Signed)
 Dolor de rodilla crnico en los adultos Chronic Knee Pain, Adult El dolor de rodilla que dura ms de 3 meses se denomina dolor de rodilla crnico. Puede sentir dolor en una o en ambas rodillas. Los sntomas de dolor de rodilla crnico tambin pueden incluir hinchazn y rigidez. Muchas afecciones pueden causar dolor crnico en las rodillas. La causa ms frecuente es el desgaste de la articulacin de la rodilla a medida que se envejece. Otras causas posibles incluyen las siguientes: Una enfermedad que causa inflamacin de la rodilla, como artritis reumatoide. Por lo general, afecta ambas rodillas. Una afeccin llamada artritis inflamatoria, como la gota. Una lesin en la rodilla que causa artritis. Una lesin en la rodilla que daa los ligamentos. Los ligamentos son tejidos que [employer]. Rodilla de corredor o dolor detrs de la rtula. El tratamiento para el dolor de rodilla crnico depende de su causa. Los principales tratamientos para el dolor de rodilla crnico son los siguientes: Hacer ejercicios para ayudar a que la rodilla se mueva mejor y se fortalezca, lo que se llama fisioterapia. Si tiene sobrepeso, [employer]. Esta afeccin tambin puede tratarse con medicamentos, inyecciones, una rodillera o un dispositivo ortopdico, y el uso de [place]. El mdico tambin puede recomendar reposo, hielo, presin (compresin) y elevacin, tambin llamado terapia de RHCE. Siga estas instrucciones en su casa: Si tiene una rodillera o dispositivo ortopdico que se puede retirar:  Use la rodillera o el dispositivo ortopdico como se lo haya indicado el mdico. Producer, television/film/video solo si el mdico lo autoriza. Controle [employer] piel a su alrededor. Informe al mdico si observa problemas. Afloje la rodillera o el dispositivo ortopdico si los dedos de los pies se le entumecen, siente hormigueos o se le enfran y se tornan de Research officer, trade union. Mantenga limpio y seco el dispositivo ortopdico o la  rodillera. Baos Si la rodillera o el dispositivo ortopdico no son impermeables: No deje que se mojen. Cbralos para ducharse o baarse. Use una cubierta que no permita que [place]. Control del dolor, la rigidez y la hinchazn     Si se lo indican, aplique calor sobre la zona. Hgalo con la frecuencia que le hayan indicado. Use la fuente de calor que el mdico le recomiende, como una compresa de calor hmedo o una almohadilla trmica. Si tiene una rodillera o un dispositivo ortopdico que se puede quitar, retrelo como se lo hayan indicado. Coloque una toalla entre la piel y la fuente de Airline pilot. Aplique calor durante 20 a 30 minutos. Si se lo indican, aplique hielo sobre la zona. Si tiene una rodillera o un dispositivo ortopdico que se puede quitar, retrelo como se lo hayan indicado. Ponga el hielo en una bolsa plstica. Coloque una toalla entre la piel y Copy. Aplique el hielo durante 20 minutos, 2 o 3 veces por da. Si la piel se le pone de color rojo brillante, retire el hielo o Company secretary de inmediato para evitar daos en la piel. El [place] de dao es mayor si no puede sentir dolor, Airline pilot o fro. Mueva los dedos del pie con frecuencia para reducir la rigidez y la hinchazn. Cuando est sentado o acostado, mantenga la zona lesionada por encima del nivel del corazn. Use una almohada para sostener el pie segn sea necesario. Actividad Evite las actividades en las que ambos pies no estn en contacto con el piso al [employer]. Evite correr, saltar la soga y hacer saltos de tijera. Siga el plan de ejercicios que  el Orthoptist. El mdico puede recomendarle lo siguiente: Automotive engineer las actividades que empeoren el dolor de rodilla. Esto puede requerir que cambie sus rutinas de ejercicio, participacin en deportes u obligaciones laborales. Usar calzado con suelas acolchonadas. Evitar deportes que requieran correr y Multimedia programmer de direccin repentinamente. Realizar fisioterapia. La  fisioterapia ayuda a la rodilla a fortalecerse y [employer]. Haga ejercicio segn las indicaciones. Haga ejercicios que mejoren el equilibrio y la fuerza, [place] tai chi y el yoga. No se pare ni camine sobre la rodilla [employer] digan que puede [place]. Utilice las [employer] se le haya indicado. Retome sus actividades habituales cuando se lo indiquen. Pregunte qu cosas son seguras para que haga. Instrucciones generales Use los medicamentos solamente como se lo haya indicado el mdico. Si tiene sobrepeso, trabaje con su mdico y un experto en alimentacin saludable, llamado nutricionista, para establecer metas para bajar de peso. Perder incluso un poco de peso puede reducir el dolor de rodilla. El sobrepeso puede aumentar el dolor de rodilla. No fume ni use vapeadores o productos que tengan nicotina o tabaco. Si necesita ayuda para dejar de fumar, hable con su mdico. Concurra a todas las visitas de seguimiento. El mdico controlar su dolor y probar otros tratamientos si es necesario. Comunquese con un mdico si: Tiene un dolor de rodilla que no mejora o que [place]. No puede hacer los ejercicios debido al dolor de rodilla. Solicite ayuda de inmediato si: La rodilla se hincha y la hinchazn empeora. No puede mover la rodilla. Siente dolor intenso en la rodilla. Esta informacin no tiene Theme park manager el consejo del mdico. Asegrese de hacerle al mdico cualquier pregunta que tenga. Document Revised: 03/28/2023 Document Reviewed: 03/28/2023 Elsevier Patient Education  2024 [employer].

## 2025-01-22 ENCOUNTER — Telehealth: Payer: Self-pay

## 2025-01-22 ENCOUNTER — Other Ambulatory Visit: Payer: Self-pay

## 2025-01-22 ENCOUNTER — Ambulatory Visit: Payer: Self-pay | Admitting: Family Medicine

## 2025-01-22 LAB — CBC WITH DIFFERENTIAL/PLATELET
Basophils Absolute: 0 x10E3/uL (ref 0.0–0.2)
Basos: 0 %
EOS (ABSOLUTE): 0.1 x10E3/uL (ref 0.0–0.4)
Eos: 3 %
Hematocrit: 40.2 % (ref 34.0–46.6)
Hemoglobin: 12.9 g/dL (ref 11.1–15.9)
Immature Grans (Abs): 0 x10E3/uL (ref 0.0–0.1)
Immature Granulocytes: 0 %
Lymphocytes Absolute: 2.2 x10E3/uL (ref 0.7–3.1)
Lymphs: 41 %
MCH: 30.6 pg (ref 26.6–33.0)
MCHC: 32.1 g/dL (ref 31.5–35.7)
MCV: 95 fL (ref 79–97)
Monocytes Absolute: 0.5 x10E3/uL (ref 0.1–0.9)
Monocytes: 10 %
Neutrophils Absolute: 2.4 x10E3/uL (ref 1.4–7.0)
Neutrophils: 46 %
Platelets: 298 x10E3/uL (ref 150–450)
RBC: 4.22 x10E6/uL (ref 3.77–5.28)
RDW: 12.5 % (ref 11.7–15.4)
WBC: 5.3 x10E3/uL (ref 3.4–10.8)

## 2025-01-22 LAB — VITAMIN D 25 HYDROXY (VIT D DEFICIENCY, FRACTURES): Vit D, 25-Hydroxy: 41.4 ng/mL (ref 30.0–100.0)

## 2025-01-22 NOTE — Telephone Encounter (Signed)
 Noted.     Copied from CRM #[id number]. Topic: Clinical - Lab/Test Results >> Jan 22, 2025  3:27 PM Delon DASEN wrote: Reason for CRM: read results message verbatim, no questions

## 2025-02-23 ENCOUNTER — Ambulatory Visit

## 2025-02-23 ENCOUNTER — Encounter

## 2025-02-23 ENCOUNTER — Other Ambulatory Visit

## 2025-05-07 ENCOUNTER — Encounter: Payer: Self-pay | Admitting: Vascular Surgery

## 2025-05-07 ENCOUNTER — Ambulatory Visit (HOSPITAL_COMMUNITY): Payer: Self-pay

## 2025-06-16 ENCOUNTER — Ambulatory Visit: Payer: Self-pay | Admitting: Family Medicine
